# Patient Record
Sex: Male | Born: 1950 | Race: Black or African American | Hispanic: No | Marital: Married | Smoking: Never smoker
Health system: Southern US, Community
[De-identification: ages and names within clinical notes are randomized; demographics above are authoritative.]

## PROBLEM LIST (undated history)

## (undated) DIAGNOSIS — E785 Hyperlipidemia, unspecified: Secondary | ICD-10-CM

## (undated) DIAGNOSIS — K635 Polyp of colon: Secondary | ICD-10-CM

## (undated) DIAGNOSIS — M1990 Unspecified osteoarthritis, unspecified site: Secondary | ICD-10-CM

## (undated) DIAGNOSIS — I10 Essential (primary) hypertension: Secondary | ICD-10-CM

## (undated) DIAGNOSIS — Z87442 Personal history of urinary calculi: Secondary | ICD-10-CM

## (undated) HISTORY — PX: COLONOSCOPY: SHX174

## (undated) HISTORY — PX: CATARACT EXTRACTION: SUR2

## (undated) HISTORY — PX: TOTAL HIP ARTHROPLASTY: SHX124

## (undated) HISTORY — DX: Hyperlipidemia, unspecified: E78.5

## (undated) HISTORY — DX: Polyp of colon: K63.5

---

## 2017-05-29 ENCOUNTER — Encounter: Payer: Self-pay | Admitting: Sports Medicine

## 2017-05-29 ENCOUNTER — Ambulatory Visit (INDEPENDENT_AMBULATORY_CARE_PROVIDER_SITE_OTHER): Payer: Medicare Other | Admitting: Sports Medicine

## 2017-05-29 VITALS — BP 140/70 | HR 61 | Ht 72.0 in | Wt 255.8 lb

## 2017-05-29 DIAGNOSIS — M1611 Unilateral primary osteoarthritis, right hip: Secondary | ICD-10-CM | POA: Diagnosis not present

## 2017-05-29 MED ORDER — CELECOXIB 200 MG PO CAPS: 200.0000 mg | ORAL_CAPSULE | Freq: Every day | ORAL | 1 refills | Status: DC

## 2017-05-29 NOTE — Progress Notes (Signed)
Veverly FellsMichael D. Delorise [name], DO  [place] [facility] [phone]  Gerald AlyJohn Espana - 67 y.o. male MRN [id number]  Date of birth: [birth year]  Visit Date: 05/29/2017  PCP: [name], [name], NP   Referred by: No ref. provider found   Scribe for today's visit: Christoper FabianMolly Weber, LAT, ATC     SUBJECTIVE:  Gerald Dorsey is here for New Patient (Initial Visit) (R hip and thigh pain) .   His R quad symptoms INITIALLY: Began approximately 9 months with no MOI Described as Aching 5-6/10  Pain at it's worst but has no pain at rest.  Will occasionally be stabbing. Nonradiating Worsened with increased activity, golfing (rotational movements are worse than linear).  Increased pain w/ ascending stairs compared to descending. Improved with IBU and Famoditine. Additional associated symptoms include: no radiating pain and no N/T    At this time symptoms are worsening compared to onset increased pain w/ physical activity. He has been taking IBU and Famoditine.  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 [name] at [facility].  Had a R hip xray about a year ago.   ROS Denies night time disturbances. Denies fevers, chills, or night sweats. Denies unexplained weight loss. Denies personal history of cancer. Denies changes in bowel or bladder habits. Denies recent unreported falls. Denies new or worsening dyspnea or wheezing. Denies headaches or dizziness.  Denies numbness, tingling or weakness  In the extremities.  Denies dizziness or presyncopal episodes Denies lower extremity edema     HISTORY & PERTINENT PRIOR DATA:  Prior History reviewed and updated per electronic medical record.  Significant history, findings, studies and interim changes include:  reports that  has never smoked. he has never used smokeless tobacco. No results for input(s): HGBA1C, LABURIC, CREATINE in the last 8760 hours. No specialty comments available. Problem    Primary Osteoarthritis of Right Hip    OBJECTIVE:  VS:  HT:6' (182.9 cm)   WT:255 lb 12.8 oz (116 kg)  BMI:34.69    BP:140/70  HR:61bpm  TEMP: ( )  RESP:93 %   PHYSICAL EXAM: Constitutional: WDWN, Non-toxic appearing. Psychiatric: Alert & appropriately interactive. Not depressed or anxious appearing. Respiratory: No increased work of breathing. Trachea Midline Eyes: Pupils are equal. EOM intact without nystagmus. No scleral icterus Cardiovascular:  Peripheral Pulses: peripheral pulses symmetrical No clubbing or cyanosis appreciated Capillary Refill is normal, less than 2 seconds No signficant generalized edema/anasarca Sensory Exam: intact to light touch  Right hip: Pain with FADIR and Faber testing.  Worse with Stinchfield testing.  Hip abduction strength is intact.  Logroll is positive.  Negative straight leg raise.   Findings:  [facility] that are not able to be obtained but physical exam is consistent with osteoarthritis.  Has been told that he needs a hip replacement    ASSESSMENT & PLAN:   1. Primary osteoarthritis of right hip    PLAN: Symptoms are consistent with osteoarthritis of the hip to options including injection versus changing NSAID therapy.  We will try Celebrex and if any lack of improvement we will consider intra-articular injection in 2 weeks prior to his upcoming trip to [place].  >50% of this 30 minute visit spent in direct patient counseling and/or coordination of care.  Discussion was focused on education regarding the in discussing the pathoetiology and anticipated clinical course of the above condition.  ++++++++++++++++++++++++++++++++++++++++++++ Orders & Meds: No orders of the  defined types were placed in this encounter.   Meds ordered this encounter  Medications  . celecoxib (CELEBREX) 200 MG capsule    Sig: Take 1 capsule (200 mg total) by mouth daily.    Dispense:  90 capsule    Refill:  1     ++++++++++++++++++++++++++++++++++++++++++++ Follow-up: Return in about 2 weeks (around 06/12/2017) for For consideration of injection.   Pertinent documentation may be included in additional procedure notes, imaging studies, problem based documentation and patient instructions. Please see these sections of the encounter for additional information regarding this visit. CMA/ATC served as Neurosurgeon during this visit. History, Physical, and Plan performed by medical provider. Documentation and orders reviewed and attested to.      [name], DO    [place] [facility]

## 2017-06-06 ENCOUNTER — Ambulatory Visit: Payer: Self-pay

## 2017-06-06 NOTE — Telephone Encounter (Signed)
Patient called in to verify how long to take the Celebrex, informed patient the doctor did not indicate in his note how many days, advise to continue to take until his next OV on 06/19/17 and it would be discussed with the doctor at that time, he verbalized understanding and says the medicine is working.

## 2017-06-08 DIAGNOSIS — M1611 Unilateral primary osteoarthritis, right hip: Secondary | ICD-10-CM | POA: Insufficient documentation

## 2017-06-19 ENCOUNTER — Ambulatory Visit (INDEPENDENT_AMBULATORY_CARE_PROVIDER_SITE_OTHER): Payer: Medicare Other | Admitting: Sports Medicine

## 2017-06-19 ENCOUNTER — Encounter: Payer: Self-pay | Admitting: Sports Medicine

## 2017-06-19 VITALS — BP 138/82 | HR 60 | Ht 72.0 in | Wt 255.0 lb

## 2017-06-19 DIAGNOSIS — M1611 Unilateral primary osteoarthritis, right hip: Secondary | ICD-10-CM

## 2017-06-19 NOTE — Progress Notes (Signed)
Gerald FellsMichael D. Dorsey [name], DO  [place] [facility] [place] [phone]  Gerald AlyJohn Dorsey - 67 y.o. male MRN [id number]  Date of birth: [birth year]  Visit Date: 06/19/2017  PCP: Gerald JunglingEdwards, [name], NP   Referred by: Gerald JunglingEdwards, [name], NP   Scribe for today's visit: Gerald FabianMolly Dorsey, LAT, ATC     SUBJECTIVE:  Gerald Dorsey is here for Follow-up (R hip pain) .   Notes from initial visit on 05/29/17: His R quad symptoms INITIALLY: Began approximately 9 months with no MOI Described as Aching 5-6/10  Pain at it's worst but has no pain at rest.  Will occasionally be stabbing. Nonradiating Worsened with increased activity, golfing (rotational movements are worse than linear).  Increased pain w/ ascending stairs compared to descending. Improved with IBU and Famoditine. Additional associated symptoms include: no radiating pain and no N/T    At this time symptoms are worsening compared to onset increased pain w/ physical activity. He has been taking IBU and Famoditine.  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 [name] at [facility].  Had a R hip xray about a year ago.    Compared to the last office visit on 05/29/17, his previously described R hip pain symptoms are improving w/ less pain w/ walking for exercise.  States that he doesn't get the giving-way sensation anymore. Current symptoms are mild-moderate & are nonradiating He has been taking Celebrex.    ROS Denies night time disturbances. Denies fevers, chills, or night sweats. Denies unexplained weight loss. Denies personal history of cancer. Denies changes in bowel or bladder habits. Denies recent unreported falls. Denies new or worsening dyspnea or wheezing. Reports headaches or dizziness. Mild HA since beginning the Celebrex. Denies numbness, tingling or weakness  In the extremities.  Denies dizziness or presyncopal episodes Denies lower extremity edema     HISTORY &  PERTINENT PRIOR DATA:  Prior History reviewed and updated per electronic medical record.  Significant history, findings, studies and interim changes include:  reports that  has never smoked. he has never used smokeless tobacco. No results for input(s): HGBA1C, LABURIC, CREATINE in the last 8760 hours. No specialty comments available. No problems updated.  OBJECTIVE:  VS:  HT:6' (182.9 cm)   WT:255 lb (115.7 kg)  BMI:34.58    BP:138/82  HR:60bpm  TEMP: ( )  RESP:96 %   PHYSICAL EXAM: Constitutional: WDWN, Non-toxic appearing. Psychiatric: Alert & appropriately interactive.  Not depressed or anxious appearing. Respiratory: No increased work of breathing.  Trachea Midline Eyes: Pupils are equal.  EOM intact without nystagmus.  No scleral icterus  NEUROVASCULAR exam: No clubbing or cyanosis appreciated No significant venous stasis changes Capillary Refill: normal, less than 2 seconds   LOWER Extremities  Pre-tibial edema: No significant pretibial edema Pedal Pulses: Normal & symmetrically palpable Dermatomes intact to light touch Normal strength in all myotomes Reflexes: Normal & symmetric DTRs   Mildly limited internal rotation of the right hip compared to the left.  No pain with this.  No pain with axial load and circumduction of the hip.  Small amount of pain with Stinchfield. No additional findings.   ASSESSMENT & PLAN:   1. Primary osteoarthritis of right hip    PLAN: Overall he is done quite well.  Celebrex has been beneficial and would like for him to continue this for an additional 4 weeks of 6 weeks total and then can discontinue only due to the minimal pain that he  is having.  If he is having any type of worsening over the next several weeks he will call for an injection. We will plan for intermittent dosing after 6-weeks. No problem-specific Assessment & Plan notes found for this encounter.   ++++++++++++++++++++++++++++++++++++++++++++ Orders & Meds: No orders  of the defined types were placed in this encounter.   No orders of the defined types were placed in this encounter.   ++++++++++++++++++++++++++++++++++++++++++++ Follow-up: Return in about 4 weeks (around 07/17/2017).   Pertinent documentation may be included in additional procedure notes, imaging studies, problem based documentation and patient instructions. Please see these sections of the encounter for additional information regarding this visit. CMA/ATC served as Neurosurgeon during this visit. History, Physical, and Plan performed by medical provider. Documentation and orders reviewed and attested to.      Gerald Mews, DO    [place] [facility] Physician

## 2017-06-23 ENCOUNTER — Ambulatory Visit (INDEPENDENT_AMBULATORY_CARE_PROVIDER_SITE_OTHER): Payer: Medicare Other | Admitting: Sports Medicine

## 2017-06-23 ENCOUNTER — Ambulatory Visit: Payer: Self-pay

## 2017-06-23 ENCOUNTER — Encounter: Payer: Self-pay | Admitting: Sports Medicine

## 2017-06-23 VITALS — BP 142/84 | HR 54 | Ht 72.0 in | Wt 256.0 lb

## 2017-06-23 DIAGNOSIS — M1611 Unilateral primary osteoarthritis, right hip: Secondary | ICD-10-CM

## 2017-06-23 DIAGNOSIS — M25551 Pain in right hip: Secondary | ICD-10-CM | POA: Diagnosis not present

## 2017-06-23 NOTE — Procedures (Signed)
PROCEDURE NOTE:  Ultrasound Guided: Injection: Right hip, intra-articular Images were obtained and interpreted by myself, Gaspar BiddingMichael Jordy Hewins, DO  Images have been saved and stored to PACS system. Images obtained on: GE S7 Ultrasound machine  ULTRASOUND FINDINGS:  Osteophytic spurring of the anterior labrum  DESCRIPTION OF PROCEDURE:  The patient's clinical condition is marked by substantial pain and/or significant functional disability. Other conservative therapy has not provided relief, is contraindicated, or not appropriate. There is a reasonable likelihood that injection will significantly improve the patient's pain and/or functional impairment.  After discussing the risks, benefits and expected outcomes of the injection and all questions were reviewed and answered, the patient wished to undergo the above named procedure. Verbal consent was obtained.  The ultrasound was used to identify the target structure and adjacent neurovascular structures. The skin was then prepped in sterile fashion and the target structure was injected under direct visualization using sterile technique as below:  PREP: Alcohol, Ethel Chloride,  APPROACH: Anterior, direct, stopcock technique, 22g 3.5in. INJECTATE: 5 cc 1% lidocaine, 1cc 0.5% marcaine, 1cc 80mg /mL DepoMedrol   ASPIRATE: None   DRESSING: Band-Aid  Post procedural instructions including recommending icing and warning signs for infection were reviewed.   This procedure was well tolerated and there were no complications.    IMPRESSION: Succesful Ultrasound Guided: Injection

## 2017-06-23 NOTE — Progress Notes (Addendum)
  Veverly FellsMichael D. Delorise [name], DO  [place] [facility] [place] [phone]  Gerald AlyJohn Burrough - 67 y.o. male MRN [id number]  Date of birth: 18-Jun-1950  Visit Date: 06/23/2017  PCP: [name], [name], NP   Referred by: [name], [name], NP   Scribe for today's visit: Christoper FabianMolly Weber, LAT, ATC     SUBJECTIVE:  Gerald Dorsey is here for Follow-up (R hip pain) .   Compared to the last office visit on 06/19/17, his previously described R hip and thigh pain symptoms show no change but says he pushed himself in terms of exercise this week and started to experience some giving-way in his R leg/hip. Current symptoms are moderate & are radiating to the R thigh. He has been taking Celebrex and IBU and famotidine.   ROS Denies night time disturbances. Denies fevers, chills, or night sweats. Denies unexplained weight loss. Denies personal history of cancer. Denies changes in bowel or bladder habits. Denies recent unreported falls. Denies new or worsening dyspnea or wheezing. Denies headaches or dizziness.  Denies numbness, tingling or weakness  In the extremities.  Denies dizziness or presyncopal episodes Denies lower extremity edema     HISTORY & PERTINENT PRIOR DATA:  Prior History reviewed and updated per electronic medical record.  Significant history, findings, studies and interim changes include:  reports that  has never smoked. he has never used smokeless tobacco. No results for input(s): HGBA1C, LABURIC, CREATINE in the last 8760 hours. No specialty comments available. Problem  Primary Osteoarthritis of Right Hip   Injected today to 05/2017     OBJECTIVE:  VS:  HT:6' (182.9 cm)   WT:256 lb (116.1 kg)  BMI:34.71    BP:(!) 142/84  HR:(!) 54bpm  TEMP: ( )  RESP:96 %   PHYSICAL EXAM: Constitutional: WDWN, Non-toxic appearing. Psychiatric: Alert & appropriately interactive.  Not depressed or anxious appearing. Respiratory: No increased work of breathing.  Trachea  Midline Eyes: Pupils are equal.  EOM intact without nystagmus.  No scleral icterus  NEUROVASCULAR exam: No clubbing or cyanosis appreciated No significant venous stasis changes Capillary Refill: normal, less than 2 seconds    Right hip has somewhat limited range of motion.  Pain radiates to the thigh.  He does walk with a slightly coxalgia gait but this is minimal. No additional findings.   ASSESSMENT & PLAN:   1. Right hip pain   2. Primary osteoarthritis of right hip    PLAN:    Primary osteoarthritis of right hip Follow-up in 6 weeks   ++++++++++++++++++++++++++++++++++++++++++++ Orders & Meds: Orders Placed This Encounter  Procedures  . US MSK POCT ULTRASOUND    No orders of the defined types were placed in this encounter.   ++++++++++++++++++++++++++++++++++++++++++++ Follow-up: Return in about 6 weeks (around 08/04/2017).   Pertinent documentation may be included in additional procedure notes, imaging studies, problem based documentation and patient instructions. Please see these sections of the encounter for additional information regarding this visit. CMA/ATC served as Neurosurgeonscribe during this visit. History, Physical, and Plan performed by medical provider. Documentation and orders reviewed and attested to.      Andrena MewsMichael D Janeil Schexnayder, DO    [facility] Physician

## 2017-06-23 NOTE — Patient Instructions (Signed)

## 2017-06-23 NOTE — Assessment & Plan Note (Signed)
Follow-up in 6 weeks

## 2017-07-10 ENCOUNTER — Ambulatory Visit: Payer: Medicare Other | Admitting: Sports Medicine

## 2017-07-11 ENCOUNTER — Encounter: Payer: Self-pay | Admitting: Sports Medicine

## 2017-07-11 ENCOUNTER — Ambulatory Visit (INDEPENDENT_AMBULATORY_CARE_PROVIDER_SITE_OTHER): Payer: Medicare Other

## 2017-07-11 ENCOUNTER — Ambulatory Visit (INDEPENDENT_AMBULATORY_CARE_PROVIDER_SITE_OTHER): Payer: Medicare Other | Admitting: Sports Medicine

## 2017-07-11 VITALS — BP 140/82 | HR 71 | Ht 72.0 in | Wt 253.6 lb

## 2017-07-11 DIAGNOSIS — M25551 Pain in right hip: Secondary | ICD-10-CM | POA: Diagnosis not present

## 2017-07-11 DIAGNOSIS — M1611 Unilateral primary osteoarthritis, right hip: Secondary | ICD-10-CM

## 2017-07-11 DIAGNOSIS — M79604 Pain in right leg: Secondary | ICD-10-CM

## 2017-07-11 MED ORDER — GABAPENTIN 300 MG PO CAPS: ORAL_CAPSULE | ORAL | 1 refills | Status: DC

## 2017-07-11 NOTE — Patient Instructions (Signed)
Also check out [employer]"Foundation Training" which is a program developed by Dr. Myles LippsEric Goodman.   There are links to a couple of his YouTube Videos below and I would like to see performing one of his videos 5-6 days per week.    A good intro video is: "Independence from Pain 7-minute Video" - [email or web address]   His more advanced video is: "Powerful Posture and Pain Relief: 12 minutes of Foundation Training" - https://youtu.be/[email or web address]  Do not try to attempt this entire video when first beginning.    Try breaking of each exercise that he goes into shorter segments.  Otherwise if they perform an exercise for 45 seconds, start with 15 seconds and rest and then resume when they begin the new activity.    If you work your way up to doing this 12 minute video, I expect you will see significant improvements in your pain.  If you enjoy his videos and would like to find out more you can look on his website: [email or web address].  He has a workout streaming option as well as a DVD set available for purchase.  Amazon has the best price for his DVDs.     Please perform the exercise program that we have prepared for you and gone over in detail on a daily basis.  In addition to the handout you were provided you can access your program through: [email or web address]   Your unique program code is: [email or web address]

## 2017-07-11 NOTE — Progress Notes (Signed)
Veverly FellsMichael D. Delorise [name], DO  [facility] [facility] [place] [phone]  Gerald AlyJohn Dorsey - 67 y.o. male MRN [id number]  Date of birth: 1950/09/17  Visit Date: 07/11/2017  PCP: [name], [name], NP   Referred by: [name], [name], NP   Scribe for today's visit: Stevenson ClinchBrandy Coleman, CMA     SUBJECTIVE:  Gerald AlyJohn Dorsey is here for Follow-up (R thigh pain)  06/23/17 Compared to the last office visit on 06/19/17, his previously described R hip and thigh pain symptoms show no change but says he pushed himself in terms of exercise this week and started to experience some giving-way in his R leg/hip. Current symptoms are moderate & are radiating to the R thigh. He has been taking Celebrex and IBU and famotidine.   07/11/17 Compared to the last office visit, his previously described symptoms are improving, injection helped with hip/groin pain but he continues to have pain in the R thigh. He has noticed continued soreness in the thigh when going for walks. When he is sedentary he doesn't have pain. He has noticed increased stiffness after sitting for prolonged periods of time. He has also noticed the hip catching.  Current symptoms are moderate & are nonradiating He has been taking Celebrex only prn with some relief. He has d/c IBU and famotidine while on Celebrex.    ROS Denies night time disturbances. Denies fevers, chills, or night sweats. Denies unexplained weight loss. Denies personal history of cancer. Denies changes in bowel or bladder habits. Denies recent unreported falls. Denies new or worsening dyspnea or wheezing. Denies headaches or dizziness.  Denies numbness, tingling or weakness  In the extremities.  Denies dizziness or presyncopal episodes Denies lower extremity edema     HISTORY & PERTINENT PRIOR DATA:  Prior History reviewed and updated per electronic medical record.  Significant history, findings, studies and interim changes include:  reports that  has  never smoked. he has never used smokeless tobacco. No results for input(s): HGBA1C, LABURIC, CREATINE in the last 8760 hours. No specialty comments available. No problems updated.  OBJECTIVE:  VS:  HT:6' (182.9 cm)   WT:253 lb 9.6 oz (115 kg)  BMI:34.39    BP:140/82  HR:71bpm  TEMP: ( )  RESP:95 %   PHYSICAL EXAM: Constitutional: WDWN, Non-toxic appearing. Psychiatric: Alert & appropriately interactive.  Not depressed or anxious appearing. Respiratory: No increased work of breathing.  Trachea Midline Eyes: Pupils are equal.  EOM intact without nystagmus.  No scleral icterus  NEUROVASCULAR exam: No clubbing or cyanosis appreciated No significant venous stasis changes Capillary Refill: normal, less than 2 seconds   Leg and hip: Overall good range of motion of his hips with internal and external rotation without significant pain.  Pain is present with terminal with a tear.  He has minimal pain with femoral stretch test does have a slightly tight hip flexor on the right compared to left.  Negative straight leg raise.  Small amount of pain over the anterior thigh but this is minimal.  He does have a slight weakness with hip flexion on the right compared to the left.  He has no significant palpable defect within the mid thigh which is where he does localize majority of his pain.   ASSESSMENT & PLAN:   1. Right leg pain    ++++++++++++++++++++++++++++++++++++++++++++ Orders & Meds:  Orders Placed This Encounter  Procedures  . DG Lumbar Spine Complete    Meds ordered this encounter  Medications  . gabapentin (NEURONTIN) 300  MG capsule    Sig: Start with 1 tab po qhs X 1 week, then increase to 1 tab po bid X 1 week then 1 tab po tid prn    Dispense:  90 capsule    Refill:  1    ++++++++++++++++++++++++++++++++++++++++++++ PLAN:  Findings:  This is a complicated issue that I believe may be more multifactorial than initially realized.  He does have a degree of osteoarthritis of  the right hip that is mild in nature.  Given the small amount of weakness that he has in the right hip concern for potential neurogenic cause was investigated with plain film x-rays today that did show some mild degenerative changes but this was once again really quite mild.  Given the pain that he is having I would like to see how he responds to gabapentin for both diagnostic and therapeutic purposes.  If he does have good improvement but persistent weakness or persistent/incomplete relief of his pain further diagnostic evaluation with EMGs/nerve conduction studies could be considered but would recommend x-rays of the hip thigh and possibly knee initially.  Also could consider MRI of the lumbar spine versus MRI of the hip.   No problem-specific Assessment & Plan notes found for this encounter.  >50% of this 25 minute visit spent in direct patient counseling and/or coordination of care.  Discussion was focused on education regarding the in discussing the pathoetiology and anticipated clinical course of the above condition.  Follow-up: Return for after you return from your trip.   Pertinent documentation may be included in additional procedure notes, imaging studies, problem based documentation and patient instructions. Please see these sections of the encounter for additional information regarding this visit. CMA/ATC served as Neurosurgeon during this visit. History, Physical, and Plan performed by medical provider. Documentation and orders reviewed and attested to.      [name], DO    [facility] Physician

## 2017-07-16 ENCOUNTER — Encounter: Payer: Self-pay | Admitting: Sports Medicine

## 2017-07-16 NOTE — Procedures (Signed)
PROCEDURE NOTE: THERAPEUTIC EXERCISES (97110) 15 minutes spent for Therapeutic exercises as below and as referenced in the AVS. This included exercises focusing on stretching, strengthening, with significant focus on eccentric aspects.  Proper technique shown and discussed handout in great detail with ATC. All questions were discussed and answered.   Long term goals include an improvement in range of motion, strength, endurance as well as avoiding reinjury. Frequency of visits is one time as determined during today's  office visit. Frequency of exercises to be performed is as per handout.  EXERCISES REVIEWED:  Hip abduction strengthening  Hip stretching and strengthening  Core back conditioning  [employer] program

## 2017-07-26 ENCOUNTER — Encounter: Payer: Self-pay | Admitting: Sports Medicine

## 2017-08-01 ENCOUNTER — Ambulatory Visit (INDEPENDENT_AMBULATORY_CARE_PROVIDER_SITE_OTHER): Payer: Medicare Other

## 2017-08-01 ENCOUNTER — Encounter: Payer: Self-pay | Admitting: Sports Medicine

## 2017-08-01 ENCOUNTER — Ambulatory Visit: Payer: Medicare Other | Admitting: Sports Medicine

## 2017-08-01 ENCOUNTER — Other Ambulatory Visit: Payer: Self-pay | Admitting: Sports Medicine

## 2017-08-01 ENCOUNTER — Ambulatory Visit (INDEPENDENT_AMBULATORY_CARE_PROVIDER_SITE_OTHER): Payer: Medicare Other | Admitting: Sports Medicine

## 2017-08-01 VITALS — BP 130/80 | HR 58 | Ht 72.0 in | Wt 254.6 lb

## 2017-08-01 DIAGNOSIS — M170 Bilateral primary osteoarthritis of knee: Secondary | ICD-10-CM

## 2017-08-01 DIAGNOSIS — M25561 Pain in right knee: Secondary | ICD-10-CM

## 2017-08-01 DIAGNOSIS — M25562 Pain in left knee: Secondary | ICD-10-CM

## 2017-08-01 DIAGNOSIS — M1611 Unilateral primary osteoarthritis, right hip: Secondary | ICD-10-CM

## 2017-08-01 DIAGNOSIS — M79604 Pain in right leg: Secondary | ICD-10-CM

## 2017-08-01 MED ORDER — FAMOTIDINE 20 MG PO TABS
20.0000 mg | ORAL_TABLET | Freq: Two times a day (BID) | ORAL | 1 refills | Status: DC
Start: 2017-08-01 — End: 2018-04-24

## 2017-08-01 MED ORDER — IBUPROFEN 200 MG PO TABS: 200.0000 mg | ORAL_TABLET | Freq: Four times a day (QID) | ORAL | 1 refills | Status: DC | PRN

## 2017-08-01 NOTE — Progress Notes (Signed)
  OBJECTIVE:  VS:  HT:6' (182.9 cm)   WT:254 lb 9.6 oz (115.5 kg)  BMI:34.52    BP:130/80  HR: (!) 58 bpm  TEMP: ( )  RESP:94 %   PHYSICAL EXAM: Constitutional: WDWN, Non-toxic appearing. Psychiatric: Alert & appropriately interactive.  Not depressed or anxious appearing. Respiratory: No increased work of breathing.  Trachea Midline Eyes: Pupils are equal.  EOM intact without nystagmus.  No scleral icterus  NEUROVASCULAR exam: No clubbing or cyanosis appreciated No significant venous stasis changes Capillary Refill: normal, less than 2 seconds   Bilateral legs overall well aligned without significant deformity.  He has limited internal rotation of the right hip compared to the left.  He has full flexion extension bilateral knees but does have crepitation with patellar grind on the left greater than right.  Ligamentously his knees are stable. Pain with Stinchfield testing and slightly painful with Pearlean BrownieFaber testing and although this does seem to localize to his distal thigh where he is most painful.  Extensor mechanism strength is intact.  Hip abduction strength is intact and quite good.  ASSESSMENT & PLAN:   1. Right leg pain   2. Arthralgia of both knees   3. Primary osteoarthritis of right hip   4. Primary osteoarthritis of both knees    Orders & Meds:  Orders Placed This Encounter  Procedures  . XR KNEE 3 VIEW LEFT  . XR Knee 1-2 Views Right  . XR FEMUR, MIN 2 VIEWS RIGHT    Meds ordered this encounter  Medications  . ibuprofen (ADVIL,MOTRIN) 200 MG tablet    Sig: Take 1 tablet (200 mg total) by mouth every 6 (six) hours as needed.    Dispense:  30 tablet    Refill:  1  . famotidine (PEPCID) 20 MG tablet    Sig: Take 1 tablet (20 mg total) by mouth 2 (two) times daily. Take with NSAID    Dispense:  60 tablet    Refill:  1     PLAN:    Primary osteoarthritis of right hip Unfortunately he had only minimal improvement in his symptoms following intra-articular  injection.  He does report that sharp shooting pain localizing to his groin is significantly improved however the dull thigh pain that is constantly present and worsens with activity has not changed in character or nature at all.  He is interested in possibly pursuing repeat injection in 2 weeks to see if he can have any additional benefit from Kenalog as opposed to Depo-Medrol and I do think this is worthwhile.  In the interim we will have him restart generic Duexis and avoid exacerbating activities.   Primary osteoarthritis of both knees He does have mild changes of the right knee and moderate on the left.  We discussed using compression with Body Helix Compression Sleeve and continuing with avoidance activities.  Can consider injections in the future if he is interested.   Follow-up: Return in 2 weeks (on 08/15/2017).

## 2017-08-01 NOTE — Assessment & Plan Note (Signed)
Unfortunately he had only minimal improvement in his symptoms following intra-articular injection.  He does report that sharp shooting pain localizing to his groin is significantly improved however the dull thigh pain that is constantly present and worsens with activity has not changed in character or nature at all.  He is interested in possibly pursuing repeat injection in 2 weeks to see if he can have any additional benefit from Kenalog as opposed to Depo-Medrol and I do think this is worthwhile.  In the interim we will have him restart generic Duexis and avoid exacerbating activities.

## 2017-08-01 NOTE — Patient Instructions (Signed)
I recommend you obtained a compression sleeve to help with your joint problems. There are many options on the market however I recommend obtaining a knee Body Helix compression sleeve.  You can find information (including how to appropriate measure yourself for sizing) can be found at www.Body Helix.com.  Many of these products are health savings account (HSA) eligible.   You can use the compression sleeve at any time throughout the day but is most important to use while being active as well as for 2 hours post-activity.   It is appropriate to ice following activity with the compression sleeve in place.   

## 2017-08-01 NOTE — Assessment & Plan Note (Signed)
He does have mild changes of the right knee and moderate on the left.  We discussed using compression with Body Helix Compression Sleeve and continuing with avoidance activities.  Can consider injections in the future if he is interested.

## 2017-08-01 NOTE — Procedures (Signed)
X-Rays obtained at [facility] Interpreted by myself [name](Michael D Rigby, DO) during office visit.  Results were reviewed with the patient at the time of the visit.   3 VIEW X-RAY of: Bilateral knees & 2 views right femur FINDINGS:   Bilateral knees do show mild degenerative changes and bilateral medial and patellofemoral compartments most notably on the left medial compartment.  There is osteophytic spurring in slight cortical irregularity.  No acute fracture or dislocation appreciated  Normal contours of the femur  IMPRESSION:  1. Left knee moderate osteoarthritis most notably in the medial compartment and patellofemoral compartment 2. Right knee mild arthritis of the patellofemoral compartment 3. No specific findings of the femur to explain his mid thigh pain, likely related to his underlying osteoarthritis of his hip.

## 2017-08-01 NOTE — Progress Notes (Signed)
  Veverly FellsMichael D. Delorise [name], DO  [facility] [phone]  Gerald AlyJohn Dorsey - 67 y.o. male MRN [id number]  Date of birth: 10/28/1950  Visit Date: 08/01/2017  PCP: [name], [name], NP   Referred by: [name], [name], NP   Scribe for today's visit: Stevenson ClinchBrandy Coleman, CMA     SUBJECTIVE:  Gerald AlyJohn Dorsey is here for Follow-up (R hip and leg pain)  07/11/17 Compared to the last office visit, his previously described symptoms are improving, injection helped with hip/groin pain but he continues to have pain in the R thigh. He has noticed continued soreness in the thigh when going for walks. When he is sedentary he doesn't have pain. He has noticed increased stiffness after sitting for prolonged periods of time. He has also noticed the hip catching.  Current symptoms are moderate & are nonradiating He has been taking Celebrex only prn with some relief. He has d/c IBU and famotidine while on Celebrex.   08/01/17: Compared to the last office visit, his previously described symptoms show no change. He has noticed occasional sharp pain in the groin. The majority of the pain is in the lower thigh. Recently he has noticed pain in the L groin. He has also started to notice increased pain in the L knee. He hasn't noticed any swelling around the knee but has noticed increased tightness and he has difficulty completely straightening the leg.  Current symptoms are moderate & are nonradiating He has been taking Gabapentin with minimal relief.  He has been taking IBU prn for pain. He has been active recently, playing golf. He has been noticing a catching pain when walking from the golf cart. He has not discomfort when sitting or lying down.  He has been doing home exercises on days when he is less active. He noticed increased soreness in the thigh the following day.    ROS Denies night time disturbances. Denies fevers, chills, or night sweats. Denies unexplained weight loss. Denies  personal history of cancer. Denies changes in bowel or bladder habits. Denies recent unreported falls. Denies new or worsening dyspnea or wheezing. Denies headaches or dizziness.  Denies numbness, tingling or weakness  In the extremities.  Denies dizziness or presyncopal episodes Denies lower extremity edema    HISTORY & PERTINENT PRIOR DATA:  Prior History reviewed and updated per electronic medical record.  Significant history, findings, studies and interim changes include:  reports that  has never smoked. he has never used smokeless tobacco. No results for input(s): HGBA1C, LABURIC, CREATINE in the last 8760 hours. No specialty comments available. Problem  Primary Osteoarthritis of Both Knees  Primary Osteoarthritis of Right Hip   Injected 06/23/2017 with DEPO-Medrol 80mg  X-ray at [facility]) - Mild to moderate per report       Please see additional documentation for Objective, Assessment and Plan sections. Pertinent additional documentation may be included in corresponding procedure notes, imaging studies, problem based documentation and patient instructions. Please see these sections of the encounter for additional information regarding this visit.  CMA/ATC served as Neurosurgeonscribe during this visit. History, Physical, and Plan performed by medical provider. Documentation and orders reviewed and attested to.      Andrena MewsMichael D Aleyah Balik, DO    [facility] Physician

## 2017-08-16 ENCOUNTER — Ambulatory Visit (INDEPENDENT_AMBULATORY_CARE_PROVIDER_SITE_OTHER): Payer: Medicare Other | Admitting: Sports Medicine

## 2017-08-16 ENCOUNTER — Ambulatory Visit: Payer: Self-pay

## 2017-08-16 ENCOUNTER — Encounter: Payer: Self-pay | Admitting: Sports Medicine

## 2017-08-16 VITALS — BP 130/80 | HR 59 | Ht 72.0 in | Wt 249.8 lb

## 2017-08-16 DIAGNOSIS — M79604 Pain in right leg: Secondary | ICD-10-CM

## 2017-08-16 DIAGNOSIS — M1611 Unilateral primary osteoarthritis, right hip: Secondary | ICD-10-CM

## 2017-08-16 DIAGNOSIS — M25561 Pain in right knee: Secondary | ICD-10-CM | POA: Diagnosis not present

## 2017-08-16 DIAGNOSIS — M25562 Pain in left knee: Secondary | ICD-10-CM

## 2017-08-16 DIAGNOSIS — M170 Bilateral primary osteoarthritis of knee: Secondary | ICD-10-CM

## 2017-08-16 NOTE — Patient Instructions (Signed)

## 2017-08-16 NOTE — Procedures (Signed)
PROCEDURE NOTE:  Ultrasound Guided: Injection: Left knee Images were obtained and interpreted by myself, Gaspar BiddingMichael Rigby, DO  Images have been saved and stored to PACS system. Images obtained on: GE S7 Ultrasound machine    ULTRASOUND FINDINGS:  Small joint effusion  DESCRIPTION OF PROCEDURE:  The patient's clinical condition is marked by substantial pain and/or significant functional disability. Other conservative therapy has not provided relief, is contraindicated, or not appropriate. There is a reasonable likelihood that injection will significantly improve the patient's pain and/or functional impairment.   After discussing the risks, benefits and expected outcomes of the injection and all questions were reviewed and answered, the patient wished to undergo the above named procedure.  Verbal consent was obtained.  The ultrasound was used to identify the target structure and adjacent neurovascular structures. The skin was then prepped in sterile fashion and the target structure was injected under direct visualization using sterile technique as below:  PREP: Alcohol, Ethel Chloride  APPROACH: superiolateral, single injection, 25g 1.5in.  INJECTATE:2cc: 0.5% marcaine, 1cc: 40mg /mL DepoMedrol  ASPIRATE: None   DRESSING: Band-Aid    Post procedural instructions including recommending icing and warning signs for infection were reviewed.    This procedure was well tolerated and there were no complications.   IMPRESSION: Succesful Ultrasound Guided: Injection

## 2017-08-16 NOTE — Procedures (Signed)
PROCEDURE NOTE:  Ultrasound Guided: Injection: Right hip Images were obtained and interpreted by myself, Gaspar BiddingMichael Evelean Bigler, DO  Images have been saved and stored to PACS system. Images obtained on: GE S7 Ultrasound machine    ULTRASOUND FINDINGS:  Small effusion, osteophytic spurs appreciated  DESCRIPTION OF PROCEDURE:  The patient's clinical condition is marked by substantial pain and/or significant functional disability. Other conservative therapy has not provided relief, is contraindicated, or not appropriate. There is a reasonable likelihood that injection will significantly improve the patient's pain and/or functional impairment.   After discussing the risks, benefits and expected outcomes of the injection and all questions were reviewed and answered, the patient wished to undergo the above named procedure.  Verbal consent was obtained.  The ultrasound was used to identify the target structure and adjacent neurovascular structures. The skin was then prepped in sterile fashion and the target structure was injected under direct visualization using sterile technique as below:  PREP: Alcohol, Ethel Chloride APPROACH: direct, stopcock technique, 22g 3.5in.  INJECTATE: 5cc: 1% lidocaine, 2cc: 0.5% marcaine, 1cc 40mg /mL Depo-Medrol + 1cc 40mg /mL Kenalog   ASPIRATE: None   DRESSING: Band-Aid  Post procedural instructions including recommending icing and warning signs for infection were reviewed.    This procedure was well tolerated and there were no complications.   IMPRESSION: Succesful Ultrasound Guided: Injection

## 2017-08-16 NOTE — Progress Notes (Signed)
Veverly FellsMichael D. Delorise [name], DO  [facility] [phone]  Gerald AlyJohn Justo - 67 y.o. male MRN [id number]  Date of birth: 1950/10/26  Visit Date: 08/16/2017  PCP: [name], [name], NP   Referred by: [name], [name], NP  Scribe for today's visit: Christoper FabianMolly Weber, LAT, ATC     SUBJECTIVE:  Gerald Dorsey is here for Follow-up (R hip pain and L knee pain) .   07/11/17 Compared to the last office visit, his previously described symptoms are improving, injection helped with hip/groin pain but he continues to have pain in the R thigh. He has noticed continued soreness in the thigh when going for walks. When he is sedentary he doesn't have pain. He has noticed increased stiffness after sitting for prolonged periods of time. He has also noticed the hip catching.  Current symptoms are moderate & are nonradiating He has been taking Celebrex only prn with some relief. He has d/c IBU and famotidine while on Celebrex.   08/01/17: Compared to the last office visit, his previously described symptoms show no change. He has noticed occasional sharp pain in the groin. The majority of the pain is in the lower thigh. Recently he has noticed pain in the L groin. He has also started to notice increased pain in the L knee. He hasn't noticed any swelling around the knee but has noticed increased tightness and he has difficulty completely straightening the leg.  Current symptoms are moderate & are nonradiating He has been taking Gabapentin with minimal relief.  He has been taking IBU prn for pain. He has been active recently, playing golf. He has been noticing a catching pain when walking from the golf cart. He has not discomfort when sitting or lying down.  He has been doing home exercises on days when he is less active. He noticed increased soreness in the thigh the following day.   08/16/17: Compared to the last office visit on 08/16/17, his previously described R hip and L knee pain  symptoms show no change.  L knee was bothering him more. He has stopped doing his long walks and hasn't been doing any rotational activities.  He hasn't golfed in 2 weeks. Current symptoms are mild-mod in R hip/thigh and mod in his L knee & hip pain radiates to R thigh. He has been taking and famotidine and IBU.  Had XRs of his R knee and femur at his last visit.  He would like to get an injection in his R hip and hopefully his L knee too.  ROS Denies night time disturbances. Denies fevers, chills, or night sweats. Denies unexplained weight loss. Denies personal history of cancer. Denies changes in bowel or bladder habits. Denies recent unreported falls. Denies new or worsening dyspnea or wheezing. Denies headaches or dizziness.  Denies numbness, tingling or weakness  In the extremities.  Denies dizziness or presyncopal episodes Denies lower extremity edema    HISTORY & PERTINENT PRIOR DATA:  Prior History reviewed and updated per electronic medical record.  Significant/pertinent history, findings, studies include:  reports that he has never smoked. He has never used smokeless tobacco. No results for input(s): HGBA1C, LABURIC, CREATINE in the last 8760 hours. No specialty comments available. No problems updated.  OBJECTIVE:  VS:  HT:6' (182.9 cm)   WT:249 lb 12.8 oz (113.3 kg)  BMI:33.87    BP:130/80  HR:(Abnormal) 59bpm  TEMP: ( )  RESP:93 %   PHYSICAL EXAM: Constitutional: WDWN, Non-toxic appearing. Psychiatric: Alert &  appropriately interactive.  Not depressed or anxious appearing. Respiratory: No increased work of breathing.  Trachea Midline Eyes: Pupils are equal.  EOM intact without nystagmus.  No scleral icterus  VASCULAR: warm to touch calf supple with no pain with squeeze lower extremity neuro exam: unremarkable  Left knee overall well aligned with generalized swelling but no significant effusion.  He has a small amount of pain with medial and lateral joint line  palpation but is ligamentously stable and has a small click with McMurray's. Right hip continues to have pain with Stinchfield testing and logroll.  ASSESSMENT & PLAN:   1. Primary osteoarthritis of right hip   2. Right leg pain   3. Arthralgia of both knees   4. Primary osteoarthritis of both knees     PLAN: Repeat right hip injection today.  If any lack of improvement with this further diagnostic evaluation can be pursued with MRI of the hip but unfortunately it appears that he likely has more advanced arthritis in his hip and x-rays were previously suggested.  Total hip arthroplasty may be the only option that we have going forward he is aware of this.  We will plan to follow-up with him in 6 weeks and ensure both the issues of were addressed today have improved.    Follow-up: Return in about 6 weeks (around 09/27/2017).      Please see additional documentation for Objective, Assessment and Plan sections. Pertinent additional documentation may be included in corresponding procedure notes, imaging studies, problem based documentation and patient instructions. Please see these sections of the encounter for additional information regarding this visit.  CMA/ATC served as Neurosurgeon during this visit. History, Physical, and Plan performed by medical provider. Documentation and orders reviewed and attested to.      [name], DO    [facility] Physician

## 2017-08-17 ENCOUNTER — Encounter: Payer: Self-pay | Admitting: Sports Medicine

## 2017-09-27 ENCOUNTER — Ambulatory Visit: Payer: Medicare Other | Admitting: Sports Medicine

## 2017-10-03 ENCOUNTER — Ambulatory Visit (INDEPENDENT_AMBULATORY_CARE_PROVIDER_SITE_OTHER): Payer: Medicare Other | Admitting: Sports Medicine

## 2017-10-03 ENCOUNTER — Encounter: Payer: Self-pay | Admitting: Sports Medicine

## 2017-10-03 VITALS — BP 120/78 | HR 61 | Ht 72.0 in | Wt 237.6 lb

## 2017-10-03 DIAGNOSIS — M25551 Pain in right hip: Secondary | ICD-10-CM | POA: Diagnosis not present

## 2017-10-03 NOTE — Patient Instructions (Signed)

## 2017-10-03 NOTE — Progress Notes (Signed)
Gerald Dorsey. [facility] at [facility] [phone]  Gerald Dorsey - 67 y.o. male MRN [id number]  Date of birth: 04-Feb-1951  Visit Date: 10/03/2017  PCP: Filomena Jungling, NP   Referred by: Filomena Jungling, NP  Scribe for today's visit: [name], LAT, ATC     SUBJECTIVE:  Gerald Dorsey is here for Follow-up (R hip pain and L knee pain) .   07/11/17 Compared to the last office visit, his previously described symptoms are improving, injection helped with hip/groin pain but he continues to have pain in the R thigh. He has noticed continued soreness in the thigh when going for walks. When he is sedentary he doesn't have pain. He has noticed increased stiffness after sitting for prolonged periods of time. He has also noticed the hip catching.  Current symptoms are moderate & are nonradiating He has been taking Celebrex only prn with some relief. He has d/c IBU and famotidine while on Celebrex.   08/01/17: Compared to the last office visit, his previously described symptoms show no change. He has noticed occasional sharp pain in the groin. The majority of the pain is in the lower thigh. Recently he has noticed pain in the L groin. He has also started to notice increased pain in the L knee. He hasn't noticed any swelling around the knee but has noticed increased tightness and he has difficulty completely straightening the leg.  Current symptoms are moderate & are nonradiating He has been taking Gabapentin with minimal relief.  He has been taking IBU prn for pain. He has been active recently, playing golf. He has been noticing a catching pain when walking from the golf cart. He has not discomfort when sitting or lying down.  He has been doing home exercises on days when he is less active. He noticed increased soreness in the thigh the following day.   08/16/17: Compared to the last office visit on 08/01/17, his previously described R hip and L knee pain  symptoms show no change.  L knee was bothering him more. He has stopped doing his long walks and hasn't been doing any rotational activities.  He hasn't golfed in 2 weeks. Current symptoms are mild-mod in R hip/thigh and mod in his L knee & hip pain radiates to R thigh. He has been taking and famotidine and IBU.  Had XRs of his R knee and femur at his last visit.  He would like to get an injection in his R hip and hopefully his L knee too.  10/03/2017: Compared to the last office visit on 08/16/17 , his previously described R hip symptoms show no change.  L knee is doing much better.  States that his R mid thigh pain continues and feels like there's a knot in his R thigh and he notes this pain when he IR/ER his R hip.  Has 2 golf outings coming up this week. Current symptoms are mild-mod sharp and aching  & are nonradiating He has been taking famotidine and IBU.  Had knee XR on 08/01/17 and L-spine XR on 07/11/17.  Had injections into both R hip and L knee at last visit.  He feels like the L knee injection helped more than the R hip.   R hip injection helped w/ the groin pain but didn't touch his ant thigh pain.  ROS Denies night time disturbances. Denies fevers, chills, or night sweats. Denies unexplained weight loss. Denies personal history of cancer. Denies changes  in bowel or bladder habits. Denies recent unreported falls. Denies new or worsening dyspnea or wheezing. Denies headaches or dizziness.  Denies numbness, tingling or weakness  In the extremities.  Denies dizziness or presyncopal episodes Denies lower extremity edema    HISTORY & PERTINENT PRIOR DATA:  Prior History reviewed and updated per electronic medical record.  Significant/pertinent history, findings, studies include:  reports that he has never smoked. He has never used smokeless tobacco. No results for input(s): HGBA1C, LABURIC, CREATINE in the last 8760 hours. No specialty comments available. No problems  updated.  OBJECTIVE:  VS:  HT:6' (182.9 cm)   WT:237 lb 9.6 oz (107.8 kg)  BMI:32.22    BP:120/78  HR:61bpm  TEMP: ( )  RESP:95 %   PHYSICAL EXAM: Constitutional: WDWN, Non-toxic appearing. Psychiatric: Alert & appropriately interactive.  Not depressed or anxious appearing. Respiratory: No increased work of breathing.  Trachea Midline Eyes: Pupils are equal.  EOM intact without nystagmus.  No scleral icterus  Vascular Exam: warm to touch no edema  lower extremity neuro exam: unremarkable normal strength normal sensation normal reflexes  MSK Exam: Mild pain with logroll and figure testing however is most painful with Stinchfield testing.  He has good range of motion of the hip with pain localizing to the thigh with [facility] testing.  Otherwise lower extremity strength is intact.  Hip abduction strength is improved.  No significant atrophy.   ASSESSMENT & PLAN:   1. Right hip pain     PLAN: Given the ongoing issues and a good resolution of his groin pain with radicular injection with only mild improvement I pain further diagnostic evaluation with right hip MR arthrogram indicated at this time.  This is been going on for quite some time his x-rays previously reviewed only mild degenerative changes however suspect this may be more of a radio occult degenerative change versus focal labral tearing.  Follow-up: Return for mri review in person.      Please see additional documentation for Objective, Assessment and Plan sections. Pertinent additional documentation may be included in corresponding procedure notes, imaging studies, problem based documentation and patient instructions. Please see these sections of the encounter for additional information regarding this visit.  CMA/ATC served as Neurosurgeon during this visit. History, Physical, and Plan performed by medical provider. Documentation and orders reviewed and attested to.      [name], DO    [facility]  Physician

## 2017-11-03 ENCOUNTER — Ambulatory Visit
Admission: RE | Admit: 2017-11-03 | Discharge: 2017-11-03 | Disposition: A | Discharge status: Home / Self Care | Payer: Medicare Other | Source: Ambulatory Visit | Attending: Sports Medicine | Admitting: Sports Medicine

## 2017-11-03 DIAGNOSIS — M25551 Pain in right hip: Secondary | ICD-10-CM

## 2017-11-03 MED ORDER — IOPAMIDOL (ISOVUE-M 200) INJECTION 41%
12.0000 mL | Freq: Once | INTRAMUSCULAR | Status: AC
  Administered 2017-11-03: 12 mL via INTRA_ARTICULAR

## 2017-11-07 ENCOUNTER — Ambulatory Visit (INDEPENDENT_AMBULATORY_CARE_PROVIDER_SITE_OTHER): Payer: Medicare Other | Admitting: Sports Medicine

## 2017-11-07 ENCOUNTER — Encounter: Payer: Self-pay | Admitting: Sports Medicine

## 2017-11-07 VITALS — BP 142/76 | HR 64 | Ht 72.0 in | Wt 237.6 lb

## 2017-11-07 DIAGNOSIS — M1611 Unilateral primary osteoarthritis, right hip: Secondary | ICD-10-CM | POA: Diagnosis not present

## 2017-11-07 DIAGNOSIS — M25551 Pain in right hip: Secondary | ICD-10-CM

## 2017-11-07 NOTE — Patient Instructions (Signed)
Dr. Doneen Poissonhristopher Blackman at [facility]: [phone]

## 2017-11-07 NOTE — Progress Notes (Signed)
Gerald Dorsey. Gerald Dorsey [facility] [phone]  Gerald Dorsey - 67 y.o. male MRN [id number]  Date of birth: 02-15-1951  Visit Date: 11/07/2017  PCP: Filomena Jungling, NP   Referred by: Filomena Jungling, NP  Scribe(s) for today's visit: [name], LAT, ATC  SUBJECTIVE:  Gerald Dorsey is here for Follow-up (R hip and thigh) .    07/11/17 Compared to the last office visit, his previously described symptoms are improving, injection helped with hip/groin pain but he continues to have pain in the R thigh. He has noticed continued soreness in the thigh when going for walks. When he is sedentary he doesn't have pain. He has noticed increased stiffness after sitting for prolonged periods of time. He has also noticed the hip catching.  Current symptoms are moderate & are nonradiating He has been taking Celebrex only prn with some relief. He has d/c IBU and famotidine while on Celebrex.   08/01/17: Compared to the last office visit, his previously described symptoms show no change. He has noticed occasional sharp pain in the groin. The majority of the pain is in the lower thigh. Recently he has noticed pain in the L groin. He has also started to notice increased pain in the L knee. He hasn't noticed any swelling around the knee but has noticed increased tightness and he has difficulty completely straightening the leg.  Current symptoms are moderate & are nonradiating He has been taking Gabapentin with minimal relief.  He has been taking IBU prn for pain. He has been active recently, playing golf. He has been noticing a catching pain when walking from the golf cart. He has not discomfort when sitting or lying down.  He has been doing home exercises on days when he is less active. He noticed increased soreness in the thigh the following day.   08/16/17: Compared to the last office visit on 08/01/17, his previously described R hip and L knee pain symptoms  show no change.  L knee was bothering him more. He has stopped doing his long walks and hasn't been doing any rotational activities.  He hasn't golfed in 2 weeks. Current symptoms are mild-mod in R hip/thigh and mod in his L knee & hip pain radiates to R thigh. He has been taking and famotidine and IBU.  Had XRs of his R knee and femur at his last visit.  He would like to get an injection in his R hip and hopefully his L knee too.  10/03/2017: Compared to the last office visit on 08/16/17 , his previously described R hip symptoms show no change.  L knee is doing much better.  States that his R mid thigh pain continues and feels like there's a knot in his R thigh and he notes this pain when he IR/ER his R hip.  Has 2 golf outings coming up this week. Current symptoms are mild-mod sharp and aching  & are nonradiating He has been taking famotidine and IBU.  Had knee XR on 08/01/17 and L-spine XR on 07/11/17.  Had injections into both R hip and L knee at last visit.  He feels like the L knee injection helped more than the R hip.   R hip injection helped w/ the groin pain but didn't touch his ant thigh pain.  11/07/17: Compared to the last office visit on 10/03/17, his previously described R hip and thigh symptoms are improved from his very first visit.  Went for  a 1.5 mile walk yesterday and felt pretty good other than being sore and stiff. Current symptoms are mild & are radiating to R thigh. He has been taking IBU and famotidine prn.  L-spine XR on 07/11/17 and knee XR on 08/01/17.  R hip MRI arthrogram on 11/03/17. L knee and R hip injections on 08/16/17   REVIEW OF SYSTEMS: Denies night time disturbances. Denies fevers, chills, or night sweats. Denies unexplained weight loss. Denies personal history of cancer. Denies changes in bowel or bladder habits. Denies recent unreported falls. Denies new or worsening dyspnea or wheezing. Denies headaches or dizziness.  Denies numbness, tingling or  weakness  In the extremities.  Denies dizziness or presyncopal episodes Denies lower extremity edema    HISTORY & PERTINENT PRIOR DATA:  Prior History reviewed and updated per electronic medical record.  Significant/pertinent history, findings, studies include:  reports that he has never smoked. He has never used smokeless tobacco. No results for input(s): HGBA1C, LABURIC, CREATINE in the last 8760 hours. No specialty comments available. No problems updated.  OBJECTIVE:  VS:  HT:6' (182.9 cm)   WT:237 lb 9.6 oz (107.8 kg)  BMI:32.22    BP:(Abnormal) 142/76  HR:64bpm  TEMP: ( )  RESP:95 %   PHYSICAL EXAM: CONSTITUTIONAL: Well-developed, Well-nourished and In no acute distress Alert & appropriately interactive. and Not depressed or anxious appearing. Respiratory: No increased work of breathing.  Trachea Midline EYES: Pupils are equal., EOM intact without nystagmus. and No scleral icterus.  Lower extremities: Warm and well perfused NEURO: unremarkable  MSK Exam: Right hip and knee: Marland Kitchen. Well aligned, no significant deformity. . No overlying skin changes. . No focal bony tenderness . Marked pain with terminal external and internal rotation of the hip.  Positive for a year and Pearlean BrownieFaber test but mild.  Negative fulcrum test.  . Hip abduction strength is 5 out of 5 with a well-balanced but still TFL predominant recruitment pattern.   PROCEDURES & DATA REVIEWED:  MRI of the hip reviewed that does show severe right hip pain that explains the majority of his symptoms.  ASSESSMENT   1. Right hip pain   2. Primary osteoarthritis of right hip     PLAN:  >50% of this 25 minute visit spent in direct patient counseling and/or coordination of care.  Discussion was focused on education regarding the in discussing the pathoetiology and anticipated clinical course of the above condition.  Ultimately long discussion was had regarding operative versus nonoperative options.  Continued  conservative management is appropriate.  Is aware that ultimately he will likely require total hip arthroplasty to have relief of his symptoms.  He will call for an appointment and information for Dr. Magnus IvanBlackman was provided.  He is likely should have this done later in the year.   Follow-up: Return if symptoms worsen or fail to improve.      Please see additional documentation for Objective, Assessment and Plan sections. Pertinent additional documentation may be included in corresponding procedure notes, imaging studies, problem based documentation and patient instructions. Please see these sections of the encounter for additional information regarding this visit.  CMA/ATC served as Neurosurgeonscribe during this visit. History, Physical, and Plan performed by medical provider. Documentation and orders reviewed and attested to.      Andrena MewsMichael D Timtohy Broski, DO    [facility]

## 2017-11-08 ENCOUNTER — Ambulatory Visit: Payer: Medicare Other | Admitting: Sports Medicine

## 2018-01-07 ENCOUNTER — Encounter: Payer: Self-pay | Admitting: Sports Medicine

## 2018-01-25 ENCOUNTER — Telehealth: Payer: Self-pay | Admitting: Sports Medicine

## 2018-01-25 ENCOUNTER — Other Ambulatory Visit: Payer: Self-pay | Admitting: Physical Therapy

## 2018-01-25 DIAGNOSIS — M1611 Unilateral primary osteoarthritis, right hip: Secondary | ICD-10-CM

## 2018-01-25 NOTE — Telephone Encounter (Signed)
See note.   Copied from CRM [id number]. Topic: General - Other >> Jan 25, 2018  9:15 AM Trula Slade wrote: Reason for CRM:   Patient would like a return call from [facility] about a referral.  Please call him at [phone]

## 2018-01-25 NOTE — Telephone Encounter (Signed)
Returned pt's call and placed a referral for him to see Dr. Magnus Ivan at [facility] regarding his R hip and groin.

## 2018-02-07 ENCOUNTER — Ambulatory Visit (INDEPENDENT_AMBULATORY_CARE_PROVIDER_SITE_OTHER): Payer: Medicare Other | Admitting: Orthopaedic Surgery

## 2018-02-07 ENCOUNTER — Encounter (INDEPENDENT_AMBULATORY_CARE_PROVIDER_SITE_OTHER): Payer: Self-pay | Admitting: Orthopaedic Surgery

## 2018-02-07 ENCOUNTER — Ambulatory Visit (INDEPENDENT_AMBULATORY_CARE_PROVIDER_SITE_OTHER): Payer: Medicare Other

## 2018-02-07 DIAGNOSIS — M1611 Unilateral primary osteoarthritis, right hip: Secondary | ICD-10-CM | POA: Diagnosis not present

## 2018-02-07 NOTE — Progress Notes (Signed)
Office Visit Note   Patient: Gerald Dorsey           Date of Birth: 1951-05-07           MRN: [id number] Visit Date: 02/07/2018              Requested by: [name], [name], NP [address] [place], [place] [address] PCP: [name], [name], NP   Assessment & Plan: Visit Diagnoses:  1. Primary osteoarthritis of right hip     Plan: Based on his plain film findings combined with his MRI findings as well as his physical exam and the failure of conservative treatment measures of all modalities, I do feel that a total hip replacement is warranted at this point as to see.  We a long thorough discussion about what the surgery involves.  We talked about the risk and benefits of surgery.  We discussed his intraoperative and postoperative course as well.  He would like to have this done in December of this year.  I do feel this is reasonable.  He is otherwise very healthy individual with no significant active medical problems at all.  All question concerns were answered and addressed.  We will work on scheduling the surgery.  Follow-Up Instructions: Return for 2 weeks post-op.   Orders:  Orders Placed This Encounter  Procedures  . XR HIP UNILAT W OR W/O PELVIS 1V RIGHT   No orders of the defined types were placed in this encounter.     Procedures: No procedures performed   Clinical Data: No additional findings.   Subjective: Chief Complaint  Patient presents with  . Right Hip - Pain  Patient is a very pleasant 67 year old gentleman who is referred from Dr. Gaspar BiddingMichael Rigby to evaluate worsening hip pain is been going on for over a year now.  Plain films and an MRI confirmed end-stage arthritis of the right hip.  He does have groin pain and some thigh pain.  He is very active individual but is having a slow down due to the detrimental effect of his right hip pain is having on his active daily living, his mobility and his quality of life.  He has had an intra-articular injection.  He is  worked on activity modification.  He takes anti-inflammatories.  He is work on hip and quad strengthening exercises as well.  None of this is helped.  At this point he would like to consider total hip replacement surgery.  HPI  Review of Systems He currently denies any headache, chest pain, shortness of breath, fever, chills, nausea, vomiting.  Objective: Vital Signs: There were no vitals taken for this visit.  Physical Exam He is alert and oriented x3 and in no acute distress Ortho Exam Examination of his left hip is normal examination of his right hip shows stiffness with internal and external rotation and significant pain in the groin with internal and external rotation.  His leg lengths are equal. Specialty Comments:  No specialty comments available.  Imaging: Xr Hip Unilat W Or W/o Pelvis 1v Right  Result Date: 02/07/2018 An AP pelvis standing shows severe end-stage arthritis of the right hip.  There is complete loss of superior lateral joint space.  There is sclerotic changes in the femoral head and acetabulum.  There are para-articular osteophytes.  There is cystic changes in the acetabulum femoral head as well.    PMFS History: Patient Active Problem List   Diagnosis Date Noted  . Primary osteoarthritis of both knees 08/01/2017  .  Primary osteoarthritis of right hip 06/08/2017   Past Medical History:  Diagnosis Date  . Colon polyps    Hx of colon polyps per pt report - no date  . Hyperlipidemia     Family History  Problem Relation Age of Onset  . Hyperlipidemia Mother   . Hypertension Mother     History reviewed. No pertinent surgical history. Social History   Occupational History  . Not on file  Tobacco Use  . Smoking status: Never Smoker  . Smokeless tobacco: Never Used  Substance and Sexual Activity  . Alcohol use: Not on file  . Drug use: Not on file  . Sexual activity: Not on file

## 2018-04-13 ENCOUNTER — Other Ambulatory Visit (INDEPENDENT_AMBULATORY_CARE_PROVIDER_SITE_OTHER): Payer: Self-pay | Admitting: Physician Assistant

## 2018-04-16 NOTE — Pre-Procedure Instructions (Signed)
Gerald Dorsey  04/16/2018      [facility] - [place], [place] [place] [address] Phone: [phone] Fax: [phone]    Your procedure is scheduled on Tuesday November 3rd.  [facility] Admitting at 1000 A.M.  Call this number if you have problems the morning of surgery:  [phone]   Remember:  Do not eat or drink after midnight.    Take these medicines the morning of surgery with A SIP OF WATER   famotidine (PEPCID)   7 days prior to surgery STOP taking any Aspirin(unless otherwise instructed by your surgeon), Aleve, Naproxen, Ibuprofen, Motrin, Advil, Goody's, BC's, all herbal medications, fish oil, and all vitamins     Do not wear jewelry.  Do not wear lotions, powders, or colognes, or deodorant.  Men may shave face and neck.  Do not bring valuables to the hospital.  [facility] is not responsible for any belongings or valuables.  Contacts, dentures or bridgework may not be worn into surgery.  Leave your suitcase in the car.  After surgery it may be brought to your room.  For patients admitted to the hospital, discharge time will be determined by your treatment team.  Patients discharged the day of surgery will not be allowed to drive home.    [place]- Preparing For Surgery  Before surgery, you can play an important role. Because skin is not sterile, your skin needs to be as free of germs as possible. You can reduce the number of germs on your skin by washing with CHG (chlorahexidine gluconate) Soap before surgery.  CHG is an antiseptic cleaner which kills germs and bonds with the skin to continue killing germs even after washing.    Oral Hygiene is also important to reduce your risk of infection.  Remember - BRUSH YOUR TEETH THE MORNING OF SURGERY WITH YOUR REGULAR TOOTHPASTE  Please do not use if you have an allergy to CHG or antibacterial soaps. If your skin becomes  reddened/irritated stop using the CHG.  Do not shave (including legs and underarms) for at least 48 hours prior to first CHG shower. It is OK to shave your face.  Please follow these instructions carefully.   1. Shower the NIGHT BEFORE SURGERY and the MORNING OF SURGERY with CHG.   2. If you chose to wash your hair, wash your hair first as usual with your normal shampoo.  3. After you shampoo, rinse your hair and body thoroughly to remove the shampoo.  4. Use CHG as you would any other liquid soap. You can apply CHG directly to the skin and wash gently with a scrungie or a clean washcloth.   5. Apply the CHG Soap to your body ONLY FROM THE NECK DOWN.  Do not use on open wounds or open sores. Avoid contact with your eyes, ears, mouth and genitals (private parts). Wash Face and genitals (private parts)  with your normal soap.  6. Wash thoroughly, paying special attention to the area where your surgery will be performed.  7. Thoroughly rinse your body with warm water from the neck down.  8. DO NOT shower/wash with your normal soap after using and rinsing off the CHG Soap.  9. Pat yourself dry with a CLEAN TOWEL.  10. Wear CLEAN PAJAMAS to bed the night before surgery, wear comfortable clothes the morning of surgery  11. Place CLEAN SHEETS on your bed the night of  your first shower and DO NOT SLEEP WITH PETS.    Day of Surgery:  Do not apply any deodorants/lotions.  Please wear clean clothes to the hospital/surgery center.   Remember to brush your teeth WITH YOUR REGULAR TOOTHPASTE.    Please read over the following fact sheets that you were given.

## 2018-04-17 ENCOUNTER — Encounter (HOSPITAL_COMMUNITY): Payer: Self-pay

## 2018-04-17 ENCOUNTER — Other Ambulatory Visit: Payer: Self-pay

## 2018-04-17 ENCOUNTER — Encounter (HOSPITAL_COMMUNITY)
Admission: RE | Admit: 2018-04-17 | Discharge: 2018-04-17 | Disposition: A | Discharge status: Home / Self Care | Payer: Medicare Other | Source: Ambulatory Visit | Attending: Orthopaedic Surgery | Admitting: Orthopaedic Surgery

## 2018-04-17 DIAGNOSIS — Z01812 Encounter for preprocedural laboratory examination: Secondary | ICD-10-CM | POA: Diagnosis present

## 2018-04-17 HISTORY — DX: Unspecified osteoarthritis, unspecified site: M19.90

## 2018-04-17 HISTORY — DX: Personal history of urinary calculi: Z87.442

## 2018-04-17 LAB — SURGICAL PCR SCREEN
MRSA, PCR: NEGATIVE
STAPHYLOCOCCUS AUREUS: NEGATIVE

## 2018-04-17 LAB — BASIC METABOLIC PANEL
ANION GAP: 4 — AB (ref 5–15)
BUN: 13 mg/dL (ref 8–23)
CHLORIDE: 108 mmol/L (ref 98–111)
CO2: 28 mmol/L (ref 22–32)
CREATININE: 1.4 mg/dL — AB (ref 0.61–1.24)
Calcium: 9.2 mg/dL (ref 8.9–10.3)
GFR calc non Af Amer: 51 mL/min — ABNORMAL LOW (ref >60)
GFR, EST AFRICAN AMERICAN: 59 mL/min — AB (ref >60)
Glucose, Bld: 98 mg/dL (ref 70–99)
Potassium: 4.5 mmol/L (ref 3.5–5.1)
SODIUM: 140 mmol/L (ref 135–145)

## 2018-04-17 LAB — CBC
HEMATOCRIT: 43 % (ref 39.0–52.0)
Hemoglobin: 13.5 g/dL (ref 13.0–17.0)
MCH: 28.7 pg (ref 26.0–34.0)
MCHC: 31.4 g/dL (ref 30.0–36.0)
MCV: 91.3 fL (ref 80.0–100.0)
NRBC: 0 % (ref 0.0–0.2)
PLATELETS: 213 10*3/uL (ref 150–400)
RBC: 4.71 MIL/uL (ref 4.22–5.81)
RDW: 13.3 % (ref 11.5–15.5)
WBC: 5.8 10*3/uL (ref 4.0–10.5)

## 2018-04-17 NOTE — Pre-Procedure Instructions (Signed)
Gerald AlyJohn Dorsey  04/17/2018      [facility] - [place], [place] [place] [address] Phone: [phone] Fax: [phone]    Your procedure is scheduled on Tuesday December 3rd.  [facility] Ad[facility] at 1000 A.M.  Call this number if you have problems the morning of surgery:  [phone]   Remember:  Do not eat or drink after midnight.    Take these medicines the morning of surgery with A SIP OF WATER   famotidine (PEPCID)   7 days prior to surgery STOP taking any Aspirin(unless otherwise instructed by your surgeon), Aleve, Naproxen, Ibuprofen, Motrin, Advil, Goody's, BC's, all herbal medications, fish oil, and all vitamins     Do not wear jewelry.  Do not wear lotions, powders, or colognes, or deodorant.  Men may shave face and neck.  Do not bring valuables to the hospital.  [facility] is not responsible for any belongings or valuables.  Contacts, dentures or bridgework may not be worn into surgery.  Leave your suitcase in the car.  After surgery it may be brought to your room.  For patients admitted to the hospital, discharge time will be determined by your treatment team.  Patients discharged the day of surgery will not be allowed to drive home.    [place]- Preparing For Surgery  Before surgery, you can play an important role. Because skin is not sterile, your skin needs to be as free of germs as possible. You can reduce the number of germs on your skin by washing with CHG (chlorahexidine gluconate) Soap before surgery.  CHG is an antiseptic cleaner which kills germs and bonds with the skin to continue killing germs even after washing.    Oral Hygiene is also important to reduce your risk of infection.  Remember - BRUSH YOUR TEETH THE MORNING OF SURGERY WITH YOUR REGULAR TOOTHPASTE  Please do not use if you have an allergy to CHG or antibacterial soaps. If your skin becomes  reddened/irritated stop using the CHG.  Do not shave (including legs and underarms) for at least 48 hours prior to first CHG shower. It is OK to shave your face.  Please follow these instructions carefully.   1. Shower the NIGHT BEFORE SURGERY and the MORNING OF SURGERY with CHG.   2. If you chose to wash your hair, wash your hair first as usual with your normal shampoo.  3. After you shampoo, rinse your hair and body thoroughly to remove the shampoo.  4. Use CHG as you would any other liquid soap. You can apply CHG directly to the skin and wash gently with a scrungie or a clean washcloth.   5. Apply the CHG Soap to your body ONLY FROM THE NECK DOWN.  Do not use on open wounds or open sores. Avoid contact with your eyes, ears, mouth and genitals (private parts). Wash Face and genitals (private parts)  with your normal soap.  6. Wash thoroughly, paying special attention to the area where your surgery will be performed.  7. Thoroughly rinse your body with warm water from the neck down.  8. DO NOT shower/wash with your normal soap after using and rinsing off the CHG Soap.  9. Pat yourself dry with a CLEAN TOWEL.  10. Wear CLEAN PAJAMAS to bed the night before surgery, wear comfortable clothes the morning of surgery  11. Place CLEAN SHEETS on your bed the night of  your first shower and DO NOT SLEEP WITH PETS.    Day of Surgery:  Do not apply any deodorants/lotions.  Please wear clean clothes to the hospital/surgery center.   Remember to brush your teeth WITH YOUR REGULAR TOOTHPASTE.    Please read over the following fact sheets that you were given.

## 2018-04-17 NOTE — Progress Notes (Signed)
PCP - Dr. Randa EvensEdwards Cardiologist - patient denies  Chest x-ray - n/a EKG - 09/13/2017; tracing requested Stress Test - patient denies ECHO - patient denies Cardiac Cath - patient denies  Sleep Study - patient denies  Blood Thinner Instructions: patient instructed to stop celebrex 7 days prior to surgery  Anesthesia review: yes, abnormal EKG (Care Everywhere); tracing requested from [facility]  Patient denies shortness of breath, fever, cough and chest pain at PAT appointment   Patient verbalized understanding of instructions that were given to them at the PAT appointment. Patient was also instructed that they will need to review over the PAT instructions again at home before surgery.

## 2018-04-18 NOTE — Anesthesia Preprocedure Evaluation (Addendum)
Anesthesia Evaluation  Patient identified by MRN, date of birth, ID band Patient awake    Reviewed: Allergy & Precautions, NPO status , Patient's Chart, lab work & pertinent test results  History of Anesthesia Complications Negative for: history of anesthetic complications  Airway Mallampati: II  TM Distance: >3 FB Neck ROM: Full    Dental no notable dental hx.    Pulmonary neg pulmonary ROS,    Pulmonary exam normal        Cardiovascular negative cardio ROS Normal cardiovascular exam     Neuro/Psych negative neurological ROS  negative psych ROS   GI/Hepatic negative GI ROS, Neg liver ROS,   Endo/Other  negative endocrine ROS  Renal/GU Renal InsufficiencyRenal disease (kidney stones; Cr 1.4)  negative genitourinary   Musculoskeletal  (+) Arthritis , Osteoarthritis,    Abdominal   Peds  Hematology negative hematology ROS (+)   Anesthesia Other Findings   Reproductive/Obstetrics                            Anesthesia Physical Anesthesia Plan  ASA: II  Anesthesia Plan: Spinal   Post-op Pain Management:    Induction:   PONV Risk Score and Plan: 1 and Propofol infusion, TIVA and Treatment may vary due to age or medical condition  Airway Management Planned: Nasal Cannula and Simple Face Mask  Additional Equipment: None  Intra-op Plan:   Post-operative Plan:   Informed Consent: I have reviewed the patients History and Physical, chart, labs and discussed the procedure including the risks, benefits and alternatives for the proposed anesthesia with the patient or authorized representative who has indicated his/her understanding and acceptance.     Plan Discussed with:   Anesthesia Plan Comments: (EKG from [facility] during ED admission for renal stone 09/13/2017 shows diffuse nonspecific ST elevations. Pt with no cardiac history. No CV symptoms. Discussed with Dr. Noreene LarssonJoslin, will order preop  EKG to be done DOS for baseline/comparison.  )      Anesthesia Quick Evaluation

## 2018-04-23 MED ORDER — CEFAZOLIN SODIUM-DEXTROSE 2-4 GM/100ML-% IV SOLN
2.0000 g | INTRAVENOUS | Status: AC
  Administered 2018-04-24: 2 g via INTRAVENOUS
  Filled 2018-04-23: qty 100

## 2018-04-23 MED ORDER — TRANEXAMIC ACID-NACL 1000-0.7 MG/100ML-% IV SOLN
1000.0000 mg | INTRAVENOUS | Status: AC
  Administered 2018-04-24: 1000 mg via INTRAVENOUS
  Filled 2018-04-23: qty 100

## 2018-04-24 ENCOUNTER — Encounter (HOSPITAL_COMMUNITY)
Admission: RE | Disposition: A | Discharge status: Home Health | Payer: Self-pay | Source: Home / Self Care | Attending: Orthopaedic Surgery

## 2018-04-24 ENCOUNTER — Inpatient Hospital Stay (HOSPITAL_COMMUNITY)
Admission: RE | Admit: 2018-04-24 | Discharge: 2018-04-26 | DRG: 470 | Disposition: A | Discharge status: Home Health | Payer: Medicare Other | Attending: Orthopaedic Surgery | Admitting: Orthopaedic Surgery

## 2018-04-24 ENCOUNTER — Inpatient Hospital Stay (HOSPITAL_COMMUNITY): Payer: Medicare Other

## 2018-04-24 ENCOUNTER — Other Ambulatory Visit: Payer: Self-pay

## 2018-04-24 ENCOUNTER — Inpatient Hospital Stay (HOSPITAL_COMMUNITY): Payer: Medicare Other | Admitting: Certified Registered Nurse Anesthetist

## 2018-04-24 ENCOUNTER — Inpatient Hospital Stay (HOSPITAL_COMMUNITY): Payer: Medicare Other | Admitting: Physician Assistant

## 2018-04-24 ENCOUNTER — Encounter (HOSPITAL_COMMUNITY): Payer: Self-pay | Admitting: *Deleted

## 2018-04-24 DIAGNOSIS — Z8349 Family history of other endocrine, nutritional and metabolic diseases: Secondary | ICD-10-CM

## 2018-04-24 DIAGNOSIS — Z8249 Family history of ischemic heart disease and other diseases of the circulatory system: Secondary | ICD-10-CM | POA: Diagnosis not present

## 2018-04-24 DIAGNOSIS — M2170 Unequal limb length (acquired), unspecified site: Secondary | ICD-10-CM | POA: Diagnosis present

## 2018-04-24 DIAGNOSIS — Z96641 Presence of right artificial hip joint: Secondary | ICD-10-CM

## 2018-04-24 DIAGNOSIS — Z87442 Personal history of urinary calculi: Secondary | ICD-10-CM

## 2018-04-24 DIAGNOSIS — Z419 Encounter for procedure for purposes other than remedying health state, unspecified: Secondary | ICD-10-CM

## 2018-04-24 DIAGNOSIS — M170 Bilateral primary osteoarthritis of knee: Secondary | ICD-10-CM | POA: Diagnosis present

## 2018-04-24 DIAGNOSIS — M1611 Unilateral primary osteoarthritis, right hip: Principal | ICD-10-CM | POA: Diagnosis present

## 2018-04-24 DIAGNOSIS — M25551 Pain in right hip: Secondary | ICD-10-CM | POA: Diagnosis present

## 2018-04-24 HISTORY — PX: TOTAL HIP ARTHROPLASTY: SHX124

## 2018-04-24 SURGERY — ARTHROPLASTY, HIP, TOTAL, ANTERIOR APPROACH
Anesthesia: Spinal | Site: Hip | Laterality: Right

## 2018-04-24 MED ORDER — OXYCODONE HCL 5 MG PO TABS: 5.0000 mg | ORAL_TABLET | Freq: Once | ORAL | Status: DC | PRN

## 2018-04-24 MED ORDER — OXYCODONE HCL 5 MG PO TABS
ORAL_TABLET | ORAL | Status: AC
  Filled 2018-04-24: qty 2

## 2018-04-24 MED ORDER — METHOCARBAMOL 500 MG PO TABS
ORAL_TABLET | ORAL | Status: AC
  Filled 2018-04-24: qty 1

## 2018-04-24 MED ORDER — DIPHENHYDRAMINE HCL 12.5 MG/5ML PO ELIX: 12.5000 mg | ORAL_SOLUTION | ORAL | Status: DC | PRN

## 2018-04-24 MED ORDER — CHLORHEXIDINE GLUCONATE 4 % EX LIQD
60.0000 mL | Freq: Once | CUTANEOUS | Status: DC
  Administered 2018-04-24: 4 via TOPICAL

## 2018-04-24 MED ORDER — PROPOFOL 500 MG/50ML IV EMUL
INTRAVENOUS | Status: DC | PRN
  Administered 2018-04-24: 75 ug/kg/min via INTRAVENOUS

## 2018-04-24 MED ORDER — ONDANSETRON HCL 4 MG PO TABS: 4.0000 mg | ORAL_TABLET | Freq: Four times a day (QID) | ORAL | Status: DC | PRN

## 2018-04-24 MED ORDER — OXYCODONE HCL 5 MG/5ML PO SOLN: 5.0000 mg | Freq: Once | ORAL | Status: DC | PRN

## 2018-04-24 MED ORDER — ALUM & MAG HYDROXIDE-SIMETH 200-200-20 MG/5ML PO SUSP: 30.0000 mL | ORAL | Status: DC | PRN

## 2018-04-24 MED ORDER — SODIUM CHLORIDE 0.9 % IR SOLN
Status: DC | PRN
  Administered 2018-04-24: 1

## 2018-04-24 MED ORDER — METHOCARBAMOL 1000 MG/10ML IJ SOLN
500.0000 mg | Freq: Four times a day (QID) | INTRAVENOUS | Status: DC | PRN
  Filled 2018-04-24: qty 5

## 2018-04-24 MED ORDER — BUPIVACAINE IN DEXTROSE 0.75-8.25 % IT SOLN
INTRATHECAL | Status: DC | PRN
  Administered 2018-04-24: 1.8 mL via INTRATHECAL

## 2018-04-24 MED ORDER — OXYCODONE HCL 5 MG PO TABS
10.0000 mg | ORAL_TABLET | ORAL | Status: DC | PRN
  Administered 2018-04-24: 15 mg via ORAL
  Administered 2018-04-24: 10 mg via ORAL
  Administered 2018-04-25 (×2): 15 mg via ORAL
  Administered 2018-04-25: 10 mg via ORAL
  Administered 2018-04-26: 15 mg via ORAL
  Filled 2018-04-24: qty 2
  Filled 2018-04-24 (×4): qty 3

## 2018-04-24 MED ORDER — LIDOCAINE HCL (CARDIAC) PF 100 MG/5ML IV SOSY
PREFILLED_SYRINGE | INTRAVENOUS | Status: DC | PRN
  Administered 2018-04-24: 100 mg via INTRAVENOUS

## 2018-04-24 MED ORDER — PROPOFOL 10 MG/ML IV BOLUS
INTRAVENOUS | Status: AC
  Filled 2018-04-24: qty 20

## 2018-04-24 MED ORDER — PHENYLEPHRINE HCL 10 MG/ML IJ SOLN
INTRAMUSCULAR | Status: DC | PRN
  Administered 2018-04-24 (×5): 80 ug via INTRAVENOUS
  Administered 2018-04-24: 40 ug via INTRAVENOUS

## 2018-04-24 MED ORDER — METOCLOPRAMIDE HCL 5 MG PO TABS: 5.0000 mg | ORAL_TABLET | Freq: Three times a day (TID) | ORAL | Status: DC | PRN

## 2018-04-24 MED ORDER — OXYCODONE HCL 5 MG PO TABS
5.0000 mg | ORAL_TABLET | ORAL | Status: DC | PRN
  Administered 2018-04-26: 5 mg via ORAL
  Filled 2018-04-24: qty 2

## 2018-04-24 MED ORDER — ASPIRIN 81 MG PO CHEW
81.0000 mg | CHEWABLE_TABLET | Freq: Two times a day (BID) | ORAL | Status: DC
  Administered 2018-04-24 – 2018-04-26 (×4): 81 mg via ORAL
  Filled 2018-04-24 (×4): qty 1

## 2018-04-24 MED ORDER — HYDROMORPHONE HCL 1 MG/ML IJ SOLN: 0.5000 mg | INTRAMUSCULAR | Status: DC | PRN

## 2018-04-24 MED ORDER — FENTANYL CITRATE (PF) 250 MCG/5ML IJ SOLN
INTRAMUSCULAR | Status: AC
  Filled 2018-04-24: qty 5

## 2018-04-24 MED ORDER — PHENYLEPHRINE 40 MCG/ML (10ML) SYRINGE FOR IV PUSH (FOR BLOOD PRESSURE SUPPORT)
PREFILLED_SYRINGE | INTRAVENOUS | Status: AC
  Filled 2018-04-24: qty 10

## 2018-04-24 MED ORDER — ONDANSETRON HCL 4 MG/2ML IJ SOLN: 4.0000 mg | Freq: Four times a day (QID) | INTRAMUSCULAR | Status: DC | PRN

## 2018-04-24 MED ORDER — FENTANYL CITRATE (PF) 100 MCG/2ML IJ SOLN
INTRAMUSCULAR | Status: AC
  Filled 2018-04-24: qty 2

## 2018-04-24 MED ORDER — POLYETHYLENE GLYCOL 3350 17 G PO PACK: 17.0000 g | PACK | Freq: Every day | ORAL | Status: DC | PRN

## 2018-04-24 MED ORDER — LIDOCAINE 2% (20 MG/ML) 5 ML SYRINGE
INTRAMUSCULAR | Status: AC
  Filled 2018-04-24: qty 5

## 2018-04-24 MED ORDER — SODIUM CHLORIDE 0.9 % IV SOLN
INTRAVENOUS | Status: DC
  Administered 2018-04-24: 18:00:00 via INTRAVENOUS

## 2018-04-24 MED ORDER — PROPOFOL 10 MG/ML IV BOLUS
INTRAVENOUS | Status: DC | PRN
  Administered 2018-04-24: 40 mg via INTRAVENOUS

## 2018-04-24 MED ORDER — FENTANYL CITRATE (PF) 100 MCG/2ML IJ SOLN
INTRAMUSCULAR | Status: DC | PRN
  Administered 2018-04-24: 50 ug via INTRAVENOUS

## 2018-04-24 MED ORDER — ONDANSETRON HCL 4 MG/2ML IJ SOLN
INTRAMUSCULAR | Status: DC | PRN
  Administered 2018-04-24: 4 mg via INTRAVENOUS

## 2018-04-24 MED ORDER — METOCLOPRAMIDE HCL 5 MG/ML IJ SOLN: 5.0000 mg | Freq: Three times a day (TID) | INTRAMUSCULAR | Status: DC | PRN

## 2018-04-24 MED ORDER — ONDANSETRON HCL 4 MG/2ML IJ SOLN: 4.0000 mg | Freq: Once | INTRAMUSCULAR | Status: DC | PRN

## 2018-04-24 MED ORDER — ZOLPIDEM TARTRATE 5 MG PO TABS: 5.0000 mg | ORAL_TABLET | Freq: Every evening | ORAL | Status: DC | PRN

## 2018-04-24 MED ORDER — MENTHOL 3 MG MT LOZG: 1.0000 | LOZENGE | OROMUCOSAL | Status: DC | PRN

## 2018-04-24 MED ORDER — ACETAMINOPHEN 325 MG PO TABS
325.0000 mg | ORAL_TABLET | Freq: Four times a day (QID) | ORAL | Status: DC | PRN
  Administered 2018-04-25: 650 mg via ORAL
  Filled 2018-04-24: qty 2

## 2018-04-24 MED ORDER — ONDANSETRON HCL 4 MG/2ML IJ SOLN
INTRAMUSCULAR | Status: AC
  Filled 2018-04-24: qty 2

## 2018-04-24 MED ORDER — DOCUSATE SODIUM 100 MG PO CAPS
100.0000 mg | ORAL_CAPSULE | Freq: Two times a day (BID) | ORAL | Status: DC
  Administered 2018-04-24 – 2018-04-26 (×4): 100 mg via ORAL
  Filled 2018-04-24 (×4): qty 1

## 2018-04-24 MED ORDER — PHENOL 1.4 % MT LIQD: 1.0000 | OROMUCOSAL | Status: DC | PRN

## 2018-04-24 MED ORDER — FENTANYL CITRATE (PF) 100 MCG/2ML IJ SOLN
25.0000 ug | INTRAMUSCULAR | Status: DC | PRN
  Administered 2018-04-24: 50 ug via INTRAVENOUS

## 2018-04-24 MED ORDER — 0.9 % SODIUM CHLORIDE (POUR BTL) OPTIME
TOPICAL | Status: DC | PRN
  Administered 2018-04-24: 1000 mL

## 2018-04-24 MED ORDER — MIDAZOLAM HCL 2 MG/2ML IJ SOLN
INTRAMUSCULAR | Status: DC | PRN
  Administered 2018-04-24: 2 mg via INTRAVENOUS

## 2018-04-24 MED ORDER — MIDAZOLAM HCL 2 MG/2ML IJ SOLN
INTRAMUSCULAR | Status: AC
  Filled 2018-04-24: qty 2

## 2018-04-24 MED ORDER — LACTATED RINGERS IV SOLN
INTRAVENOUS | Status: DC
  Administered 2018-04-24 (×3): via INTRAVENOUS

## 2018-04-24 MED ORDER — METHOCARBAMOL 500 MG PO TABS
500.0000 mg | ORAL_TABLET | Freq: Four times a day (QID) | ORAL | Status: DC | PRN
  Administered 2018-04-24 – 2018-04-26 (×5): 500 mg via ORAL
  Filled 2018-04-24 (×4): qty 1

## 2018-04-24 MED ORDER — PANTOPRAZOLE SODIUM 40 MG PO TBEC
40.0000 mg | DELAYED_RELEASE_TABLET | Freq: Every day | ORAL | Status: DC
  Administered 2018-04-24 – 2018-04-26 (×3): 40 mg via ORAL
  Filled 2018-04-24 (×3): qty 1

## 2018-04-24 SURGICAL SUPPLY — 58 items
ACETAB CUP W GRIPTION 54MM (Plate) ×1 IMPLANT
ACETAB CUP W/GRIPTION 54 (Plate) ×2 IMPLANT
BENZOIN TINCTURE PRP APPL 2/3 (GAUZE/BANDAGES/DRESSINGS) ×3 IMPLANT
BLADE CLIPPER SURG (BLADE) IMPLANT
BLADE SAW SGTL 18X1.27X75 (BLADE) ×2 IMPLANT
BLADE SAW SGTL 18X1.27X75MM (BLADE) ×1
CLOSURE WOUND 1/2 X4 (GAUZE/BANDAGES/DRESSINGS) ×2
COVER SURGICAL LIGHT HANDLE (MISCELLANEOUS) ×3 IMPLANT
COVER WAND RF STERILE (DRAPES) ×3 IMPLANT
CUP ACETAB W/GRIPTION 54 (Plate) ×1 IMPLANT
DRAPE C-ARM 42X72 X-RAY (DRAPES) ×3 IMPLANT
DRAPE STERI IOBAN 125X83 (DRAPES) ×3 IMPLANT
DRAPE U-SHAPE 47X51 STRL (DRAPES) ×9 IMPLANT
DRESSING AQUACEL AG SP 3.5X10 (GAUZE/BANDAGES/DRESSINGS) ×1 IMPLANT
DRSG AQUACEL AG ADV 3.5X10 (GAUZE/BANDAGES/DRESSINGS) ×3 IMPLANT
DRSG AQUACEL AG SP 3.5X10 (GAUZE/BANDAGES/DRESSINGS) ×3
DURAPREP 26ML APPLICATOR (WOUND CARE) ×3 IMPLANT
ELECT BLADE 4.0 EZ CLEAN MEGAD (MISCELLANEOUS) ×3
ELECT BLADE 6.5 EXT (BLADE) IMPLANT
ELECT REM PT RETURN 9FT ADLT (ELECTROSURGICAL) ×3
ELECTRODE BLDE 4.0 EZ CLN MEGD (MISCELLANEOUS) ×1 IMPLANT
ELECTRODE REM PT RTRN 9FT ADLT (ELECTROSURGICAL) ×1 IMPLANT
FACESHIELD WRAPAROUND (MASK) ×9 IMPLANT
GLOVE BIOGEL PI IND STRL 8 (GLOVE) ×2 IMPLANT
GLOVE BIOGEL PI INDICATOR 8 (GLOVE) ×4
GLOVE ECLIPSE 8.0 STRL XLNG CF (GLOVE) ×3 IMPLANT
GLOVE ORTHO TXT STRL SZ7.5 (GLOVE) ×6 IMPLANT
GOWN STRL REUS W/ TWL LRG LVL3 (GOWN DISPOSABLE) ×2 IMPLANT
GOWN STRL REUS W/ TWL XL LVL3 (GOWN DISPOSABLE) ×2 IMPLANT
GOWN STRL REUS W/TWL LRG LVL3 (GOWN DISPOSABLE) ×4
GOWN STRL REUS W/TWL XL LVL3 (GOWN DISPOSABLE) ×4
HANDPIECE INTERPULSE COAX TIP (DISPOSABLE) ×2
HEAD M SROM 36MM 2 (Hips) ×1 IMPLANT
KIT BASIN OR (CUSTOM PROCEDURE TRAY) ×3 IMPLANT
KIT TURNOVER KIT B (KITS) ×3 IMPLANT
LINER NEUTRAL 36ID 54OD (Liner) ×3 IMPLANT
MANIFOLD NEPTUNE II (INSTRUMENTS) ×3 IMPLANT
NS IRRIG 1000ML POUR BTL (IV SOLUTION) ×3 IMPLANT
PACK TOTAL JOINT (CUSTOM PROCEDURE TRAY) ×3 IMPLANT
PAD ARMBOARD 7.5X6 YLW CONV (MISCELLANEOUS) ×3 IMPLANT
SET HNDPC FAN SPRY TIP SCT (DISPOSABLE) ×1 IMPLANT
SROM M HEAD 36MM 2 (Hips) ×3 IMPLANT
STAPLER VISISTAT 35W (STAPLE) IMPLANT
STEM CORAIL KLA12 (Stem) ×3 IMPLANT
STRIP CLOSURE SKIN 1/2X4 (GAUZE/BANDAGES/DRESSINGS) ×4 IMPLANT
SUT ETHIBOND NAB CT1 #1 30IN (SUTURE) ×3 IMPLANT
SUT MNCRL AB 4-0 PS2 18 (SUTURE) IMPLANT
SUT VIC AB 0 CT1 27 (SUTURE) ×2
SUT VIC AB 0 CT1 27XBRD ANBCTR (SUTURE) ×1 IMPLANT
SUT VIC AB 1 CT1 27 (SUTURE) ×2
SUT VIC AB 1 CT1 27XBRD ANBCTR (SUTURE) ×1 IMPLANT
SUT VIC AB 2-0 CT1 27 (SUTURE) ×2
SUT VIC AB 2-0 CT1 TAPERPNT 27 (SUTURE) ×1 IMPLANT
TOWEL OR 17X24 6PK STRL BLUE (TOWEL DISPOSABLE) ×3 IMPLANT
TOWEL OR 17X26 10 PK STRL BLUE (TOWEL DISPOSABLE) ×3 IMPLANT
TRAY CATH 16FR W/PLASTIC CATH (SET/KITS/TRAYS/PACK) IMPLANT
TRAY FOLEY MTR SLVR 16FR STAT (SET/KITS/TRAYS/PACK) ×3 IMPLANT
WATER STERILE IRR 1000ML POUR (IV SOLUTION) ×6 IMPLANT

## 2018-04-24 NOTE — Op Note (Signed)
NAMLaurence Aly: Schloemer, Herchel MEDICAL RECORD [id number] ACCOUNT [id number] DATE OF BIRTH:12-18-1950 FACILITY: [facility] LOCATION: [facility] PHYSICIAN:Kamariya Blevens Aretha ParrotY. Teila Skalsky, MD  OPERATIVE REPORT  DATE OF PROCEDURE:  04/24/2018  PREOPERATIVE DIAGNOSIS:  Primary osteoarthritis and degenerative joint disease, right hip.  POSTOPERATIVE DIAGNOSIS:  Primary osteoarthritis and degenerative joint disease, right hip.  PROCEDURE:  Right total hip arthroplasty, direct anterior approach.  IMPLANTS:  DePuy Sector Gription acetabular component size 54, size 36+0 neutral polyethylene liner, size 12 Corail femoral component with standard offset, size 36, -2 metal hip ball.  SURGEON:  Vanita PandaChristopher Y. Magnus IvanBlackman, MD  ASSISTANT:  Richardean CanalGilbert Clark, PA-C  ANESTHESIA:  Spinal.  ANTIBIOTICS:  Two grams IV Ancef.  ESTIMATED BLOOD LOSS:  250 mL.  COMPLICATIONS:  None.  INDICATIONS:  The patient is a very pleasant and active [age] year old gentleman with debilitating arthritis of his right hip.  It is detrimentally affecting his activities of daily living, quality of life and his mobility.  His pain is 10/10 and is daily.   This has rapidly gotten worse over the last year.  He has tried and failed all forms of conservative treatment.  His x-rays show significant arthritis in his right hip.  His standing films show that there is a leg length discrepancy with his right  shoulder and his left but clinically he does not feel there is a leg length discrepancy and we lay him supine, he does not feel like he has a leg length discrepancy on my exam.  We talked about the risks and benefits of surgery in detail.  Informed  consent was obtained.  DESCRIPTION OF PROCEDURE:  After informed consent was obtained and appropriate right hip was marked.  He was brought to the operating room, sat up on the stretcher.  Spinal anesthesia was obtained.  He was then laid in supine position on a stretcher.  I  assessed his leg length again found them  to be equal.  A Foley catheter was placed and traction boots were placed on both his feet.  Next, he was placed supine on the Hana fracture table with a perineal post in place and both legs in line skeletal  traction device and no traction applied.  His right operative hip was prepped and draped with DuraPrep and sterile drapes.  A time-out was called to identify correct patient, correct right hip.  We then made an incision just inferior and posterior to the  anterior superior iliac spine and carried this obliquely down the leg.  We dissected down tensor fascia lata muscle.  Tensor fascia was then divided longitudinally to proceed with direct anterior approach to the hip.  We identified and cauterized  circumflex vessels and identified the hip capsule, opened the hip capsule in an L-type format finding very large joint effusion and significant periarticular osteophytes around the femoral head and neck.  We placed Cobra retractors around the medial and  lateral femoral neck and then made our femoral neck cut with an oscillating saw and completed this with an osteotome.  I placed a corkscrew guide in the femoral head and removed the femoral head in its entirety and found a wide area devoid of cartilage.   I then placed a bent Hohmann over the medial acetabular rim and removed remnants of the acetabular labrum and other debris and then began reaming under direct visualization from a size 44 reamer in stepwise increments up to a size 53 with all reamers  under direct visualization, the last reamer under direct fluoroscopy, so that I  could obtain our depth of reaming by inclination and anteversion.  I then placed the real DePuy Sector Gription acetabular component size 54 and a 36+0 polyethylene liner for  that size acetabular component.  Attention was then turned to the femur.  With the leg externally rotated to 120 degrees, extended and adducted, I was able to use a Mueller retractor medially and a Hohmann  retractor behind the greater trochanter.  I  released lateral joint capsule and used a rongeur to lateralize.  I then began broaching from a size 8 broach using the Corail broaching system going up to a size 12.  This fit the preoperative templating as well.  We trialed a varus offset femoral neck  based off his anatomy and x-rays and a 36-2 hip ball and reduced this in the acetabulum and we were pleased with leg length, offset, range of motion and stability.  Of note, his leg lengths looked longer on intraoperative films but we know that we are  matching what he looks like in real life in the preoperative templating films in the OR as well.  We then dislocated the hip and removed the trial components.  I placed the real Corail femoral component size 12 and the real 36-2 metal hip ball and  reduced this in the acetabulum.  Again, we were pleased with range of motion, offset shuck and leg length.  We then irrigated the soft tissue with normal saline solution using pulsatile lavage.  We were able to close the joint capsule with interrupted #1  Ethibond suture, followed by #1 Vicryl and tensor fascia, 0 Vicryl in the deep tissue, 2-0 Vicryl subcutaneous tissue, 4-0 Monocryl subcuticular stitch and Steri-Strips on the skin.  An Aquacel dressing was applied.  He was then taken off the Hana table  and taken to recovery room in stable condition.  All final counts were correct.  There were no complications noted.  Note, Rexene Edison, PA-C, assisted the entire case.  His assistance was crucial for facilitating all aspects of this case.  TN/NUANCE  D:04/24/2018 T:04/24/2018 JOB:[id number]

## 2018-04-24 NOTE — Anesthesia Procedure Notes (Signed)
Spinal  Patient location during procedure: OR Staffing Anesthesiologist: Eiliana Drone E, MD Performed: anesthesiologist  Preanesthetic Checklist Completed: patient identified, surgical consent, pre-op evaluation, timeout performed, IV checked, risks and benefits discussed and monitors and equipment checked Spinal Block Patient position: sitting Prep: site prepped and draped and DuraPrep Patient monitoring: continuous pulse ox, blood pressure and heart rate Approach: midline Location: L3-4 Injection technique: single-shot Needle Needle type: Pencan  Needle gauge: 24 G Needle length: 9 cm Additional Notes Functioning IV was confirmed and monitors were applied. Sterile prep and drape, including hand hygiene and sterile gloves were used. The patient was positioned and the spine was prepped. The skin was anesthetized with lidocaine.  Free flow of clear CSF was obtained prior to injecting local anesthetic into the CSF. The needle was carefully withdrawn. The patient tolerated the procedure well.      

## 2018-04-24 NOTE — Brief Op Note (Signed)
04/24/2018  1:47 PM  PATIENT:  Gerald AlyJohn Dorsey  67 y.o. male  PRE-OPERATIVE DIAGNOSIS:  osteoarthritis right hip  POST-OPERATIVE DIAGNOSIS:  osteoarthritis right hip  PROCEDURE:  Procedure(s): RIGHT TOTAL HIP ARTHROPLASTY ANTERIOR APPROACH (Right)  SURGEON:  Surgeon(s) and Role:    Kathryne Hitch* Keondria Siever Y, MD - Primary  PHYSICIAN ASSISTANT: Rexene EdisonGil Clark, PA-C  ANESTHESIA:   spinal  EBL:  250 mL   COUNTS:  YES  DICTATION: .Other Dictation: Dictation Number [id number]  PLAN OF CARE: Admit to inpatient   PATIENT DISPOSITION:  PACU - hemodynamically stable.   Delay start of Pharmacological VTE agent (>24hrs) due to surgical blood loss or risk of bleeding: no

## 2018-04-24 NOTE — H&P (Signed)
TOTAL HIP ADMISSION H&P  Patient is admitted for right total hip arthroplasty.  Subjective:  Chief Complaint: right hip pain  HPI: Gerald Dorsey, 67 y.o. male, has a history of pain and functional disability in the right hip(s) due to arthritis and patient has failed non-surgical conservative treatments for greater than 12 weeks to include NSAID's and/or analgesics, corticosteriod injections, flexibility and strengthening excercises, use of assistive devices, weight reduction as appropriate and activity modification.  Onset of symptoms was gradual starting 3 years ago with gradually worsening course since that time.The patient noted no past surgery on the right hip(s).  Patient currently rates pain in the right hip at 10 out of 10 with activity. Patient has night pain, worsening of pain with activity and weight bearing, pain that interfers with activities of daily living, pain with passive range of motion and joint swelling. Patient has evidence of subchondral sclerosis, periarticular osteophytes and joint space narrowing by imaging studies. This condition presents safety issues increasing the risk of falls.  There is no current active infection.  Patient Active Problem List   Diagnosis Date Noted  . Primary osteoarthritis of both knees 08/01/2017  . Primary osteoarthritis of right hip 06/08/2017   Past Medical History:  Diagnosis Date  . Colon polyps    Hx of colon polyps per pt report - no date  . History of kidney stones   . Hyperlipidemia   . Osteoarthritis    right hip    Past Surgical History:  Procedure Laterality Date  . COLONOSCOPY      Current Facility-Administered Medications  Medication Dose Route Frequency Provider Last Rate Last Dose  . ceFAZolin (ANCEF) IVPB 2g/100 mL premix  2 g Intravenous To SS-Surg Kathryne Hitch, MD      . chlorhexidine (HIBICLENS) 4 % liquid 4 application  60 mL Topical Once Richardean Canal W, PA-C      . lactated ringers infusion    Intravenous Continuous Lucretia Kern, MD 10 mL/hr at 04/24/18 1042    . tranexamic acid (CYKLOKAPRON) IVPB 1,000 mg  1,000 mg Intravenous To OR Kathryne Hitch, MD       No Known Allergies  Social History   Tobacco Use  . Smoking status: Never Smoker  . Smokeless tobacco: Never Used  Substance Use Topics  . Alcohol use: Not Currently    Family History  Problem Relation Age of Onset  . Hyperlipidemia Mother   . Hypertension Mother      Review of Systems  Musculoskeletal: Positive for joint pain.  All other systems reviewed and are negative.   Objective:  Physical Exam  Constitutional: He is oriented to person, place, and time. He appears well-developed and well-nourished.  HENT:  Head: Normocephalic and atraumatic.  Eyes: Pupils are equal, round, and reactive to light. EOM are normal.  Neck: Normal range of motion. Neck supple.  Cardiovascular: Normal rate and regular rhythm.  Respiratory: Effort normal and breath sounds normal.  GI: Soft. Bowel sounds are normal.  Musculoskeletal:       Right hip: He exhibits decreased range of motion, decreased strength, tenderness and bony tenderness.  Neurological: He is alert and oriented to person, place, and time.  Skin: Skin is warm and dry.  Psychiatric: He has a normal mood and affect.    Vital signs in last 24 hours: Temp:  [98.4 F (36.9 C)] 98.4 F (36.9 C) (12/03 1014) Pulse Rate:  [63] 63 (12/03 1014) Resp:  [20] 20 (12/03 1014) BP: (  159)/(81) 159/81 (12/03 1014) SpO2:  [100 %] 100 % (12/03 1014) Weight:  [108.4 kg] 108.4 kg (12/03 1014)  Labs:   Estimated body mass index is 32.41 kg/m as calculated from the following:   Height as of this encounter: 6' (1.829 m).   Weight as of this encounter: 108.4 kg.   Imaging Review Plain radiographs demonstrate severe degenerative joint disease of the right hip(s). The bone quality appears to be excellent for age and reported activity  level.    Preoperative templating of the joint replacement has been completed, documented, and submitted to the Operating Room personnel in order to optimize intra-operative equipment management.     Assessment/Plan:  End stage arthritis, right hip(s)  The patient history, physical examination, clinical judgement of the provider and imaging studies are consistent with end stage degenerative joint disease of the right hip(s) and total hip arthroplasty is deemed medically necessary. The treatment options including medical management, injection therapy, arthroscopy and arthroplasty were discussed at length. The risks and benefits of total hip arthroplasty were presented and reviewed. The risks due to aseptic loosening, infection, stiffness, dislocation/subluxation,  thromboembolic complications and other imponderables were discussed.  The patient acknowledged the explanation, agreed to proceed with the plan and consent was signed. Patient is being admitted for inpatient treatment for surgery, pain control, PT, OT, prophylactic antibiotics, VTE prophylaxis, progressive ambulation and ADL's and discharge planning.The patient is planning to be discharged home with home health services

## 2018-04-24 NOTE — Anesthesia Postprocedure Evaluation (Signed)
Anesthesia Post Note  Patient: Gerald AlyJohn Bumpus  Procedure(s) Performed: RIGHT TOTAL HIP ARTHROPLASTY ANTERIOR APPROACH (Right Hip)     Patient location during evaluation: PACU Anesthesia Type: Spinal Level of consciousness: oriented and awake and alert Pain management: pain level controlled Vital Signs Assessment: post-procedure vital signs reviewed and stable Respiratory status: spontaneous breathing, respiratory function stable and nonlabored ventilation Cardiovascular status: blood pressure returned to baseline and stable Postop Assessment: no headache, no backache, no apparent nausea or vomiting and spinal receding Anesthetic complications: no    Last Vitals:  Vitals:   04/24/18 1545 04/24/18 1615  BP: (!) 146/68 (!) 148/76  Pulse: (!) 56 65  Resp: 15 14  Temp:    SpO2: 100% 100%    Last Pain:  Vitals:   04/24/18 1615  TempSrc:   PainSc: 2                  Lucretia Kernarolyn E Jade Burkard

## 2018-04-24 NOTE — Transfer of Care (Signed)
Immediate Anesthesia Transfer of Care Note  Patient: Gerald Dorsey  Procedure(s) Performed: RIGHT TOTAL HIP ARTHROPLASTY ANTERIOR APPROACH (Right Hip)  Patient Location: PACU  Anesthesia Type:MAC and Spinal  Level of Consciousness: drowsy  Airway & Oxygen Therapy: Patient Spontanous Breathing  Post-op Assessment: Report given to RN and Post -op Vital signs reviewed and stable  Post vital signs: Reviewed and stable  Last Vitals:  Vitals Value Taken Time  BP 110/64 04/24/2018  2:05 PM  Temp    Pulse 52 04/24/2018  2:06 PM  Resp 11 04/24/2018  2:06 PM  SpO2 100 % 04/24/2018  2:06 PM  Vitals shown include unvalidated device data.  Last Pain:  Vitals:   04/24/18 1044  TempSrc:   PainSc: 0-No pain         Complications: No apparent anesthesia complications

## 2018-04-24 NOTE — Progress Notes (Signed)
Care of pt assumed by MA Valerye Kobus RN 

## 2018-04-25 ENCOUNTER — Encounter (HOSPITAL_COMMUNITY): Payer: Self-pay | Admitting: Orthopaedic Surgery

## 2018-04-25 LAB — BASIC METABOLIC PANEL
Anion gap: 10 (ref 5–15)
BUN: 11 mg/dL (ref 8–23)
CO2: 28 mmol/L (ref 22–32)
Calcium: 8.6 mg/dL — ABNORMAL LOW (ref 8.9–10.3)
Chloride: 100 mmol/L (ref 98–111)
Creatinine, Ser: 1.44 mg/dL — ABNORMAL HIGH (ref 0.61–1.24)
GFR calc Af Amer: 57 mL/min — ABNORMAL LOW (ref >60)
GFR calc non Af Amer: 50 mL/min — ABNORMAL LOW (ref >60)
Glucose, Bld: 136 mg/dL — ABNORMAL HIGH (ref 70–99)
Potassium: 4.1 mmol/L (ref 3.5–5.1)
SODIUM: 138 mmol/L (ref 135–145)

## 2018-04-25 LAB — CBC
HCT: 38.3 % — ABNORMAL LOW (ref 39.0–52.0)
Hemoglobin: 11.9 g/dL — ABNORMAL LOW (ref 13.0–17.0)
MCH: 28.3 pg (ref 26.0–34.0)
MCHC: 31.1 g/dL (ref 30.0–36.0)
MCV: 91.2 fL (ref 80.0–100.0)
Platelets: 195 10*3/uL (ref 150–400)
RBC: 4.2 MIL/uL — ABNORMAL LOW (ref 4.22–5.81)
RDW: 13.1 % (ref 11.5–15.5)
WBC: 10.4 10*3/uL (ref 4.0–10.5)
nRBC: 0 % (ref 0.0–0.2)

## 2018-04-25 NOTE — Care Management Note (Signed)
Case Management Note  Patient Details  Name: Gerald AlyJohn Dorsey MRN: [id number] Date of Birth: 1950-11-17  Subjective/Objective:     67 yr old gentleman s/p right total hip arthroplasty.               Action/Plan: Case manager spoke with patient concerning discharge plan and DME needs. Patient was preoperatively setup with [facility], no changes. He will have support at discharge. DME will be delivered to room prior to discharge.   Expected Discharge Date:   04/26/18               Expected Discharge Plan:  Home w Home Health Services  In-House Referral:  NA  Discharge planning Services  CM Consult  Post Acute Care Choice:  Durable Medical Equipment, Home Health Choice offered to:  Patient  DME Arranged:  3-N-1, Walker rolling DME Agency:  [facility]  HH Arranged:  PT HH Agency:  [facility]  Status of Service:  Completed, signed off  If discussed at [employer] Meetings, dates discussed:    Additional Comments:  Durenda GuthrieBrady, Valery Chance Naomi, RN 04/25/2018, 12:27 PM

## 2018-04-25 NOTE — Progress Notes (Signed)
Subjective: 1 Day Post-Op Procedure(s) (LRB): RIGHT TOTAL HIP ARTHROPLASTY ANTERIOR APPROACH (Right) Patient reports pain as moderate.    Objective: Vital signs in last 24 hours: Temp:  [97.9 F (36.6 C)-99.9 F (37.7 C)] 99.8 F (37.7 C) (12/04 0438) Pulse Rate:  [43-76] 74 (12/04 0438) Resp:  [14-20] 20 (12/04 0438) BP: (109-164)/(60-81) 164/73 (12/04 0438) SpO2:  [96 %-100 %] 98 % (12/04 0438) Weight:  [108.4 kg] 108.4 kg (12/03 1014)  Intake/Output from previous day: 12/03 0701 - 12/04 0700 In: 3100.2 [P.O.:600; I.V.:2500.2] Out: 750 [Urine:500; Blood:250] Intake/Output this shift: No intake/output data recorded.  Recent Labs    04/25/18 0208  HGB 11.9*   Recent Labs    04/25/18 0208  WBC 10.4  RBC 4.20*  HCT 38.3*  PLT 195   Recent Labs    04/25/18 0208  NA 138  K 4.1  CL 100  CO2 28  BUN 11  CREATININE 1.44*  GLUCOSE 136*  CALCIUM 8.6*   No results for input(s): LABPT, INR in the last 72 hours.  Sensation intact distally Intact pulses distally Dorsiflexion/Plantar flexion intact Incision: dressing C/D/I  Assessment/Plan: 1 Day Post-Op Procedure(s) (LRB): RIGHT TOTAL HIP ARTHROPLASTY ANTERIOR APPROACH (Right) Up with therapy Plan for discharge tomorrow Discharge home with home health    Kathryne HitchChristopher Y Keriana Sarsfield 04/25/2018, 8:02 AM

## 2018-04-25 NOTE — Plan of Care (Signed)
  Problem: Pain Managment: Goal: General experience of comfort will improve Outcome: Progressing   Problem: Safety: Goal: Ability to remain free from injury will improve Outcome: Progressing   

## 2018-04-25 NOTE — Evaluation (Signed)
Physical Therapy Evaluation Patient Details Name: Gerald Dorsey MRN: [id number] DOB: 1951/04/21 Today's Date: 04/25/2018   History of Present Illness  Pt is 67 y.o. male s/p right THA direct anterior approach (04/24/18). 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   Clinical Impression  PTA pt was independent with daily activities, living at home with wife, and enjoys living an active lifestyle of golfing and walking. He says his wife is recovering from recent foot injury, but reports his son lives close and is able to assist them as needed. Pt reports increased stiffness at start of treatment, completing bed mobility with min guard and all other mobility with hands on min guard for safety. he requires increased time and effort to complete functional mobility tasks, with min cuing needed for management of RW and to relax shoulders. Pt reports right hip feels better after ambulation, education provided on importance of continued mobility to help manage pain, stiffness and edema. Skilled therapy necessary to address deficits in ROM, strength, balance and endurance to improve functional mobility. PT recommending HHPT at DC to help pt return to PLOF and active lifestyle. PT will continue to follow acutely.     Follow Up Recommendations Follow surgeon's recommendation for DC plan and follow-up therapies;Supervision for mobility/OOB    Equipment Recommendations  Rolling walker with 5" wheels       Precautions / Restrictions Precautions Precautions: Fall Restrictions Weight Bearing Restrictions: Yes RLE Weight Bearing: Weight bearing as tolerated      Mobility  Bed Mobility Overal bed mobility: Needs Assistance Bed Mobility: Supine to Sit     Supine to sit: Min guard;HOB elevated     General bed mobility comments: min guard for safety, pt able to complete supine to sit with increased time and effort to manage RLE to EOB, use of bedrail to  pull self to upright.   Transfers Overall transfer level: Needs assistance Equipment used: Rolling walker (2 wheeled) Transfers: Sit to/from Stand Sit to Stand: Min guard;From elevated surface         General transfer comment: hands on min guard for safety, bed elevated, pt able to complete sit to stand with increased time and effort. vc for foot and hand placement for optimal powerup.   Ambulation/Gait Ambulation/Gait assistance: Min guard Gait Distance (Feet): 100 Feet Assistive device: Rolling walker (2 wheeled) Gait Pattern/deviations: Step-through pattern;Decreased stride length;Decreased weight shift to right   Gait velocity interpretation: 1.31 - 2.62 ft/sec, indicative of limited community ambulator General Gait Details: hands on min guard for safety, vc to relax shoulders and keep RW on the ground, pt took several standing rest breaks, reports no increase in pain, "it just feels tight"    Balance Overall balance assessment: Needs assistance Sitting-balance support: Feet supported Sitting balance-Leahy Scale: Good     Standing balance support: No upper extremity supported Standing balance-Leahy Scale: Good Standing balance comment: pt able to use bathroom and wash hands without holding on to RW.                              Pertinent Vitals/Pain Pain Assessment: Faces Faces Pain Scale: Hurts even more Pain Location: right hip  Pain Descriptors / Indicators: Tightness;Grimacing;Guarding;Sore Pain Intervention(s): Limited activity within patient's tolerance;Monitored during session;Repositioned;Patient requesting pain meds-RN notified    Home Living Family/patient expects to be discharged to:: Private residence Living Arrangements: Spouse/significant other Available Help at Discharge: Family;Available 24 hours/day;Available  PRN/intermittently Type of Home: House Home Access: Stairs to enter Entrance Stairs-Rails: Right Entrance Stairs-Number of Steps:  1 Home Layout: Multi-level;Able to live on main level with bedroom/bathroom Home Equipment: Shower seat - built in;Hand held shower head Additional Comments: Pt's wife recovering from foot injury, son and his wife live close and able to provide assistance as needed.     Prior Function Level of Independence: Independent         Comments: works for [employer], enjoys golfing and was walking 3 miles every morning before onset of hip pain.         Extremity/Trunk Assessment   Upper Extremity Assessment Upper Extremity Assessment: Defer to OT evaluation    Lower Extremity Assessment Lower Extremity Assessment: RLE deficits/detail RLE Deficits / Details: s/p right THA    Cervical / Trunk Assessment Cervical / Trunk Assessment: Normal  Communication   Communication: No difficulties  Cognition Arousal/Alertness: Awake/alert Behavior During Therapy: WFL for tasks assessed/performed Overall Cognitive Status: Within Functional Limits for tasks assessed                                        General Comments General comments (skin integrity, edema, etc.): Minimal bleeding present through incisional site dressing, pt reports no tenderness with palpation around operative site, some edema and reports of tightness.     Exercises Total Joint Exercises Ankle Circles/Pumps: AROM;Both;10 reps;Seated   Assessment/Plan    PT Assessment Patient needs continued PT services  PT Problem List Decreased strength;Decreased range of motion;Decreased activity tolerance;Decreased balance;Decreased mobility;Decreased knowledge of use of DME;Pain       PT Treatment Interventions DME instruction;Gait training;Stair training;Functional mobility training;Therapeutic activities;Therapeutic exercise;Balance training;Neuromuscular re-education;Patient/family education    PT Goals (Current goals can be found in the Care Plan section)  Acute Rehab PT Goals Patient  Stated Goal: go home PT Goal Formulation: With patient Time For Goal Achievement: 05/09/18 Potential to Achieve Goals: Good    Frequency 7X/week    AM-PAC PT "6 Clicks" Mobility  Outcome Measure Help needed turning from your back to your side while in a flat bed without using bedrails?: A Lot Help needed moving from lying on your back to sitting on the side of a flat bed without using bedrails?: A Lot Help needed moving to and from a bed to a chair (including a wheelchair)?: A Little Help needed standing up from a chair using your arms (e.g., wheelchair or bedside chair)?: A Little Help needed to walk in hospital room?: A Little Help needed climbing 3-5 steps with a railing? : A Little 6 Click Score: 16    End of Session Equipment Utilized During Treatment: Gait belt Activity Tolerance: Patient tolerated treatment well Patient left: in chair;with call bell/phone within reach Nurse Communication: Patient requests pain meds;Mobility status PT Visit Diagnosis: Other abnormalities of gait and mobility (R26.89);Muscle weakness (generalized) (M62.81);Difficulty in walking, not elsewhere classified (R26.2);Pain Pain - Right/Left: Right Pain - part of body: Hip    Time: 0957-1030 PT Time Calculation (min) (ACUTE ONLY): 33 min   Charges:   PT Evaluation $PT Eval Low Complexity: 1 Low PT Treatments $Gait Training: 8-22 mins        Rinaldo Cloudourtney Janace Decker, SPT Acute Rehabilitation Services Office [phone]   Rinaldo CloudCourtney Sharicka Pogorzelski 04/25/2018, 11:21 AM

## 2018-04-25 NOTE — Evaluation (Signed)
Occupational Therapy Evaluation Patient Details Name: Gerald Dorsey MRN: [id number] DOB: 09-07-1950 Today's Date: 04/25/2018    History of Present Illness Pt is 67 y.o. male s/p right THA direct anterior approach (04/24/18). 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    Clinical Impression   PTA Pt independent, works full time, drives, had been walking 3 miles a day prior to hip pain - plays golf. Pt is currently min guard assist for transfers from low surfaces, mod A for LB ADL - which his wife and son can complete. Verbally educated in AE for LB ADL. Pt able to complete standing grooming at sink without UE support on RW. Spent special time educating Pt and family on 3 in 1 and all its uses as well as shower safety. OT educated on sequencing for dressing, other compensatory strategies. Education complete. Pt, wife and son verbalize understanding. OT to sign off at this time. Thank you for the opportunity to serve this patient.     Follow Up Recommendations  No OT follow up;Follow surgeon's recommendation for DC plan and follow-up therapies    Equipment Recommendations  3 in 1 bedside commode    Recommendations for Other Services       Precautions / Restrictions Precautions Precautions: Fall Restrictions Weight Bearing Restrictions: Yes RLE Weight Bearing: Weight bearing as tolerated      Mobility Bed Mobility               General bed mobility comments: in recliner at beginning and end of session  Transfers Overall transfer level: Needs assistance Equipment used: Rolling walker (2 wheeled) Transfers: Sit to/from Stand Sit to Stand: Min guard;Min assist         General transfer comment: min guard assist for boost and balance. good hand placement after initial transfer    Balance Overall balance assessment: Needs assistance Sitting-balance support: Feet supported Sitting balance-Leahy Scale: Good     Standing  balance support: No upper extremity supported Standing balance-Leahy Scale: Good Standing balance comment: able to perform sink level grooming with out UE support at sink                           ADL either performed or assessed with clinical judgement   ADL Overall ADL's : Needs assistance/impaired Eating/Feeding: Independent   Grooming: Min guard;Standing   Upper Body Bathing: Set up;Sitting   Lower Body Bathing: Moderate assistance;With caregiver independent assisting;Sitting/lateral leans Lower Body Bathing Details (indicate cue type and reason): educated on long handle sponge Upper Body Dressing : Set up;Sitting   Lower Body Dressing: Moderate assistance;With caregiver independent assisting;Sit to/from stand Lower Body Dressing Details (indicate cue type and reason): educated on reacher/grabber - "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, commercial Transfer: Min guard;Minimal assistance;Ambulation;RW Toilet Transfer Details (indicate cue type and reason): initial min guard assist for boost and balance - vc for safe hand placement Toileting- Clothing Manipulation and Hygiene: Minimal assistance;With caregiver independent assisting;Sit to/from stand   Tub/ Shower Transfer: Walk-in shower;Min guard;Ambulation;3 in 1;Shower seat   Functional mobility during ADLs: Min guard;Rolling walker       Vision Patient Visual Report: No change from baseline       Perception     Praxis      Pertinent Vitals/Pain Pain Assessment: Faces Faces Pain Scale: Hurts a little bit Pain Location: right hip  Pain Descriptors / Indicators: Tightness;Grimacing;Guarding Pain Intervention(s): Monitored during session;Repositioned  Hand Dominance     Extremity/Trunk Assessment Upper Extremity Assessment Upper Extremity Assessment: Overall WFL for tasks assessed   Lower Extremity Assessment Lower Extremity Assessment: Defer to PT evaluation   Cervical / Trunk Assessment Cervical / Trunk  Assessment: Normal   Communication Communication Communication: No difficulties   Cognition Arousal/Alertness: Awake/alert Behavior During Therapy: WFL for tasks assessed/performed Overall Cognitive Status: Within Functional Limits for tasks assessed                                     General Comments  Pt's wife and son present for education and session    Exercises Exercises: Total Joint Total Joint Exercises Ankle Circles/Pumps: AROM;Both;10 reps;Seated Heel Slides: AROM;Right;10 reps;Seated(long sitting and seated.) Hip ABduction/ADduction: AAROM;Right;5 reps;Seated(long sitting) Straight Leg Raises: AAROM;Right;5 reps;Seated(long sitting) Long Arc Quad: AROM;Right;10 reps;Seated   Shoulder Instructions      Home Living Family/patient expects to be discharged to:: Private residence Living Arrangements: Spouse/significant other Available Help at Discharge: Family;Available 24 hours/day;Available PRN/intermittently Type of Home: House Home Access: Stairs to enter Entergy CorporationEntrance Stairs-Number of Steps: 1 Entrance Stairs-Rails: Right Home Layout: Multi-level;Able to live on main level with bedroom/bathroom Alternate Level Stairs-Number of Steps: 12   Bathroom Shower/Tub: Producer, television/film/videoWalk-in shower   Bathroom Toilet: Standard     Home Equipment: Shower seat - built in;Hand held shower head   Additional Comments: Pt's wife recovering from foot injury, son and his wife live close and able to provide assistance as needed.       Prior Functioning/Environment Level of Independence: Independent        Comments: works for [employer], enjoys golfing and was walking 3 miles every morning before onset of hip pain. , enjoys golf        OT Problem List: Decreased activity tolerance;Impaired balance (sitting and/or standing)      OT Treatment/Interventions:      OT Goals(Current goals can be found in the care plan section) Acute Rehab OT Goals Patient  Stated Goal: get back to playing golf OT Goal Formulation: With patient/family Time For Goal Achievement: 04/25/18 Potential to Achieve Goals: Good  OT Frequency:     Barriers to D/C:            Co-evaluation              AM-PAC OT "6 Clicks" Daily Activity     Outcome Measure Help from another person eating meals?: None Help from another person taking care of personal grooming?: None Help from another person toileting, which includes using toliet, bedpan, or urinal?: None Help from another person bathing (including washing, rinsing, drying)?: A Lot Help from another person to put on and taking off regular upper body clothing?: None Help from another person to put on and taking off regular lower body clothing?: A Lot 6 Click Score: 20   End of Session Equipment Utilized During Treatment: Gait belt;Rolling walker Nurse Communication: Mobility status  Activity Tolerance: Patient tolerated treatment well Patient left: in chair;with call bell/phone within reach;with family/visitor present  OT Visit Diagnosis: Unsteadiness on feet (R26.81);Other abnormalities of gait and mobility (R26.89)                Time: [id number] OT Time Calculation (min): 12 min Charges:  OT General Charges $OT Visit: 1 Visit OT Evaluation $OT Eval Moderate Complexity: 1 Mod  Sherryl MangesLaura Richie Vadala OTR/L Acute Rehabilitation Services Pager: [phone] Office: [phone]  Evern Bio Kaiyu Mirabal 04/25/2018, 4:39 PM

## 2018-04-25 NOTE — Progress Notes (Signed)
Physical Therapy Treatment Patient Details Name: Gerald AlyJohn Dorsey MRN: [id number] DOB: August 25, 1950 Today's Date: 04/25/2018    History of Present Illness Pt is 67 y.o. male s/p right THA direct anterior approach (04/24/18). 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     PT Comments    Pt asleep in chair at start of treatment, agreeable to therapy. He reports he has moved some since therapy this morning, but was very stiff at beginning of mobility. Educated on seated and supine HEP with proper form. Pt completing all mobility hands on min guard progressing to min guard for safety, ambulating farther distance than he did this morning. DC plan remains appropriate pending safety with stair training tomorrow. PT will continue to follow acutely.   Follow Up Recommendations  Follow surgeon's recommendation for DC plan and follow-up therapies;Supervision for mobility/OOB     Equipment Recommendations  Rolling walker with 5" wheels       Precautions / Restrictions Precautions Precautions: Fall Restrictions Weight Bearing Restrictions: Yes RLE Weight Bearing: Weight bearing as tolerated    Mobility  Transfers Overall transfer level: Needs assistance Equipment used: Rolling walker (2 wheeled) Transfers: Sit to/from Stand Sit to Stand: Min guard;From elevated surface         General transfer comment: hands on min guard for safety, pt completed sit to stand with proper use of RW, increased time and effort with some right knee valgus.   Ambulation/Gait Ambulation/Gait assistance: Min guard Gait Distance (Feet): 160 Feet Assistive device: Rolling walker (2 wheeled) Gait Pattern/deviations: Step-through pattern;Decreased stride length;Decreased weight shift to right   Gait velocity interpretation: 1.31 - 2.62 ft/sec, indicative of limited community ambulator General Gait Details: hands on min guard for safety, vc to relax shoulders and keep  RW on the ground. Ambulates with decreased weight shift and stance time on RLE but reports no increase in pain, "it just feels tight". Pt reports stiffness decreased following ambulation.      Balance Overall balance assessment: Needs assistance Sitting-balance support: Feet supported Sitting balance-Leahy Scale: Good     Standing balance support: No upper extremity supported Standing balance-Leahy Scale: Good Standing balance comment: pt able to use bathroom and wash hands without holding on to RW.                             Cognition Arousal/Alertness: Awake/alert Behavior During Therapy: WFL for tasks assessed/performed Overall Cognitive Status: Within Functional Limits for tasks assessed                                        Exercises Total Joint Exercises Ankle Circles/Pumps: AROM;Both;10 reps;Seated Heel Slides: AROM;Right;10 reps;Seated(long sitting and seated.) Hip ABduction/ADduction: AAROM;Right;5 reps;Seated(long sitting) Straight Leg Raises: AAROM;Right;5 reps;Seated(long sitting) Long Arc Quad: AROM;Right;10 reps;Seated All exercises completed in decreased ROM, limited by pain, weakness and edema. Educated to work in tolerable range to prevent compensatory movements when completing exercises.   General Comments General comments (skin integrity, edema, etc.): Pt's wife present during treatment. Pt educated on healing and importance of mobility every hour to decrease stiffness. Encouraged pt to ambulate with staff as able to help with stiffness.      Pertinent Vitals/Pain Pain Assessment: Faces Faces Pain Scale: Hurts little more Pain Location: right hip  Pain Descriptors / Indicators: Tightness;Grimacing;Guarding Pain Intervention(s): Limited activity within  patient's tolerance;Monitored during session;Premedicated before session           PT Goals (current goals can now be found in the care plan section) Acute Rehab PT  Goals Patient Stated Goal: go home PT Goal Formulation: With patient Time For Goal Achievement: 05/09/18 Potential to Achieve Goals: Good    Frequency    7X/week      PT Plan Current plan remains appropriate       AM-PAC PT "6 Clicks" Mobility   Outcome Measure  Help needed turning from your back to your side while in a flat bed without using bedrails?: A Lot Help needed moving from lying on your back to sitting on the side of a flat bed without using bedrails?: A Lot Help needed moving to and from a bed to a chair (including a wheelchair)?: A Little Help needed standing up from a chair using your arms (e.g., wheelchair or bedside chair)?: A Little Help needed to walk in hospital room?: A Little Help needed climbing 3-5 steps with a railing? : A Little 6 Click Score: 16    End of Session Equipment Utilized During Treatment: Gait belt Activity Tolerance: Patient tolerated treatment well Patient left: in chair;with call bell/phone within reach   PT Visit Diagnosis: Other abnormalities of gait and mobility (R26.89);Muscle weakness (generalized) (M62.81);Difficulty in walking, not elsewhere classified (R26.2);Pain Pain - Right/Left: Right Pain - part of body: Hip     Time: 1610-9604 PT Time Calculation (min) (ACUTE ONLY): 29 min  Charges:  $Gait Training: 8-22 mins $Therapeutic Exercise: 8-22 mins                     Rinaldo Cloud, SPT Acute Rehabilitation Services Office [phone]    Rinaldo Cloud 04/25/2018, 4:23 PM

## 2018-04-26 MED ORDER — METHOCARBAMOL 500 MG PO TABS: 500.0000 mg | ORAL_TABLET | Freq: Four times a day (QID) | ORAL | 1 refills | Status: DC | PRN

## 2018-04-26 MED ORDER — ASPIRIN 81 MG PO CHEW: 81.0000 mg | CHEWABLE_TABLET | Freq: Two times a day (BID) | ORAL | 0 refills | Status: DC

## 2018-04-26 MED ORDER — OXYCODONE HCL 5 MG PO TABS: 5.0000 mg | ORAL_TABLET | ORAL | 0 refills | Status: DC | PRN

## 2018-04-26 NOTE — Progress Notes (Signed)
Physical Therapy Treatment Patient Details Name: Gerald Dorsey MRN: [id number] DOB: 03/11/1951 Today's Date: 04/26/2018    History of Present Illness Pt is 67 y.o. male s/p right THA direct anterior approach (04/24/18). 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     PT Comments    Pt eager to get up with therapy today. Initiated stair training today, he was able to ascend/descend stairs with use of handrails and proper sequencing with min cuing. Pt reports he has been completing HEP and was able to properly demonstrate some seated/supine exercises. Educated on healing time, safety with car transfer, and importance of mobility/HEP for return to PLOF. DC plan remains appropriate at this time, PT will see once more this afternoon prior to DC.   Follow Up Recommendations  Follow surgeon's recommendation for DC plan and follow-up therapies;Supervision for mobility/OOB     Equipment Recommendations  Rolling walker with 5" wheels       Precautions / Restrictions Precautions Precautions: Fall Restrictions Weight Bearing Restrictions: Yes RLE Weight Bearing: Weight bearing as tolerated    Mobility  Bed Mobility               General bed mobility comments: sitting on EOB at start of treatment  Transfers Overall transfer level: Needs assistance Equipment used: Rolling walker (2 wheeled) Transfers: Sit to/from Stand Sit to Stand: Min guard         General transfer comment: hands on min guard for safety, pt completed sit to stand with proper use of RW.   Ambulation/Gait Ambulation/Gait assistance: Min guard Gait Distance (Feet): 220 Feet Assistive device: Rolling walker (2 wheeled) Gait Pattern/deviations: Step-through pattern;Decreased stride length;Decreased weight shift to right   Gait velocity interpretation: 1.31 - 2.62 ft/sec, indicative of limited community ambulator General Gait Details: hands on min guard for  safety, vc to relax shoulders and keep RW on the ground. Ambulates with decreased weight shift and stance time on RLE which improves as pt ambulates longer distances.     Stairs Stairs: Yes Stairs assistance: Min guard Stair Management: Two rails;Step to pattern Number of Stairs: 5(2 regular height, 3 shortened height) General stair comments: hands on min guard for safety, pt able to navigate stairs with proper sequencing and use of both hand rails as pt reports he is able to hold on to door frame during stair navigation at home.       Balance Overall balance assessment: Needs assistance Sitting-balance support: Feet supported Sitting balance-Leahy Scale: Good     Standing balance support: No upper extremity supported Standing balance-Leahy Scale: Good Standing balance comment: pt able to brush teeth at sink without holding on to RW.                             Cognition Arousal/Alertness: Awake/alert Behavior During Therapy: WFL for tasks assessed/performed Overall Cognitive Status: Within Functional Limits for tasks assessed                                        Exercises Total Joint Exercises Heel Slides: (long sitting and seated.) Hip ABduction/ADduction: (long sitting) Straight Leg Raises: (long sitting)    General Comments General comments (skin integrity, edema, etc.): Nursing present at start of treatment. Pt able to demonstrate some of his seated/supine HEP that he reports he has been doing  since therapy yesterday.       Pertinent Vitals/Pain Pain Assessment: Faces Faces Pain Scale: Hurts little more Pain Location: right hip  Pain Descriptors / Indicators: Tightness;Grimacing;Guarding Pain Intervention(s): Limited activity within patient's tolerance;Monitored during session;RN gave pain meds during session;Repositioned           PT Goals (current goals can now be found in the care plan section) Acute Rehab PT Goals Patient  Stated Goal: go home PT Goal Formulation: With patient Time For Goal Achievement: 05/09/18 Potential to Achieve Goals: Good Progress towards PT goals: Progressing toward goals    Frequency    7X/week      PT Plan Current plan remains appropriate       AM-PAC PT "6 Clicks" Mobility   Outcome Measure  Help needed turning from your back to your side while in a flat bed without using bedrails?: A Little Help needed moving from lying on your back to sitting on the side of a flat bed without using bedrails?: A Little Help needed moving to and from a bed to a chair (including a wheelchair)?: A Little Help needed standing up from a chair using your arms (e.g., wheelchair or bedside chair)?: A Little Help needed to walk in hospital room?: A Little Help needed climbing 3-5 steps with a railing? : A Little 6 Click Score: 18    End of Session Equipment Utilized During Treatment: Gait belt Activity Tolerance: Patient tolerated treatment well Patient left: in chair;with call bell/phone within reach Nurse Communication: Mobility status PT Visit Diagnosis: Other abnormalities of gait and mobility (R26.89);Muscle weakness (generalized) (M62.81);Difficulty in walking, not elsewhere classified (R26.2);Pain Pain - Right/Left: Right Pain - part of body: Hip     Time: 1610-9604 PT Time Calculation (min) (ACUTE ONLY): 30 min  Charges:  $Gait Training: 23-37 mins                     Rinaldo Cloud, SPT Acute Rehabilitation Services Office [phone]    Rinaldo Cloud 04/26/2018, 12:00 PM

## 2018-04-26 NOTE — Discharge Summary (Signed)
Patient ID: Gerald Dorsey MRN: [id number] DOB/AGE: 1950/10/22 67 y.o.  Admit date: 04/24/2018 Discharge date: 04/26/2018  Admission Diagnoses:  Principal Problem:   Primary osteoarthritis of right hip Active Problems:   Status post total replacement of right hip   Discharge Diagnoses:  Same  Past Medical History:  Diagnosis Date  . Colon polyps    Hx of colon polyps per pt report - no date  . History of kidney stones   . Hyperlipidemia   . Osteoarthritis    right hip    Surgeries: Procedure(s): RIGHT TOTAL HIP ARTHROPLASTY ANTERIOR APPROACH on 04/24/2018   Consultants:   Discharged Condition: Improved  Hospital Course: Gerald Dorsey is an 67 y.o. male who was admitted 04/24/2018 for operative treatment ofPrimary osteoarthritis of right hip. Patient has severe unremitting pain that affects sleep, daily activities, and work/hobbies. After pre-op clearance the patient was taken to the operating room on 04/24/2018 and underwent  Procedure(s): RIGHT TOTAL HIP ARTHROPLASTY ANTERIOR APPROACH.    Patient was given perioperative antibiotics:  Anti-infectives (From admission, onward)   Start     Dose/Rate Route Frequency Ordered Stop   04/24/18 1100  ceFAZolin (ANCEF) IVPB 2g/100 mL premix     2 g 200 mL/hr over 30 Minutes Intravenous To ShortStay Surgical 04/23/18 1400 04/24/18 1225       Patient was given sequential compression devices, early ambulation, and chemoprophylaxis to prevent DVT.  Patient benefited maximally from hospital stay and there were no complications.    Recent vital signs:  Patient Vitals for the past 24 hrs:  BP Temp Temp src Pulse Resp SpO2  04/26/18 0339 (!) 156/72 100 F (37.8 C) Oral - 16 -  04/25/18 2105 (!) 162/78 100.3 F (37.9 C) Oral 90 16 97 %  04/25/18 1543 117/61 98.3 F (36.8 C) Oral 81 17 93 %  04/25/18 1424 - (!) 101.4 F (38.6 C) Oral - - -     Recent laboratory studies:  Recent Labs    04/25/18 0208  WBC 10.4  HGB 11.9*  HCT  38.3*  PLT 195  NA 138  K 4.1  CL 100  CO2 28  BUN 11  CREATININE 1.44*  GLUCOSE 136*  CALCIUM 8.6*     Discharge Medications:   Allergies as of 04/26/2018   No Known Allergies     Medication List    TAKE these medications   aspirin 81 MG chewable tablet Chew 1 tablet (81 mg total) by mouth 2 (two) times daily.   celecoxib 200 MG capsule Commonly known as:  CELEBREX Take 200 mg by mouth daily.   Coenzyme Q10 300 MG Caps Take 300 mg by mouth 2 (two) times a week.   Fish Oil 1200 MG Caps Take 1,200 mg by mouth 2 (two) times a week.   guaiFENesin 600 MG 12 hr tablet Commonly known as:  MUCINEX Take 600 mg by mouth daily as needed (congestion).   methocarbamol 500 MG tablet Commonly known as:  ROBAXIN Take 1 tablet (500 mg total) by mouth every 6 (six) hours as needed for muscle spasms.   oxyCODONE 5 MG immediate release tablet Commonly known as:  Oxy IR/ROXICODONE Take 1-2 tablets (5-10 mg total) by mouth every 4 (four) hours as needed for moderate pain (pain score 4-6).            Durable Medical Equipment  (From admission, onward)         Start     Ordered   04/24/18 1746  DME 3 n 1  Once     04/24/18 1745   04/24/18 1746  DME Walker rolling  Once    Question:  Patient needs a walker to treat with the following condition  Answer:  Status post total replacement of right hip   04/24/18 1745          Diagnostic Studies: Dg Pelvis Portable  Result Date: 04/24/2018 CLINICAL DATA:  67 year old male post right hip replacement. Subsequent encounter. EXAM: PORTABLE PELVIS 1-2 VIEWS COMPARISON:  Intraoperative imaging 04/24/2018. Preoperative MR 11/03/2017. FINDINGS: Post total right hip replacement which appears in satisfactory position without complication noted on frontal projection. Mild left hip joint degenerative changes. IMPRESSION: Post total right hip replacement. Electronically Signed   By: Lacy Duverney M.D.   On: 04/24/2018 14:27   Dg C-arm 1-60  Min  Result Date: 04/24/2018 CLINICAL DATA:  Right total hip replacement EXAM: DG C-ARM 61-120 MIN COMPARISON:  Pelvis and right hip films of 02/07/2018 FINDINGS: C-arm fluoroscopy was provided during right hip replacement. Fluoroscopy time of 40 seconds was recorded. IMPRESSION: C-arm fluoroscopy provided. Electronically Signed   By: Dwyane Dee M.D.   On: 04/24/2018 13:51   Dg Hip Operative Unilat W Or W/o Pelvis Right  Result Date: 04/24/2018 CLINICAL DATA:  Postop right hip replacement EXAM: OPERATIVE right HIP (WITH PELVIS IF PERFORMED) four VIEWS TECHNIQUE: Fluoroscopic spot image(s) were submitted for interpretation post-operatively. COMPARISON:  Pelvis and right hip films of 02/07/2018 FINDINGS: The acetabular and femoral components of the right hip replacement are in good position. No complicating features are seen. IMPRESSION: Right total hip replacement components in good position with no complicating features. Electronically Signed   By: Dwyane Dee M.D.   On: 04/24/2018 13:50    Disposition: Discharge disposition: 01-Home or Self Care         Follow-up Information    [facility] Follow up.   Specialty:  Home Health Services Why:  A representative from [facility] will contact you to arrange start date and time for your therapy. Contact information: [address] [address] [place] [address] [phone]        Kathryne Hitch, MD. Call in 2 week(s).   Specialty:  Orthopedic Surgery Contact information: [address] [place] [address] [phone]            Signed: Kathryne Hitch 04/26/2018, 12:38 PM

## 2018-04-26 NOTE — Discharge Instructions (Signed)

## 2018-04-26 NOTE — Progress Notes (Signed)
Patient ID: Gerald Dorsey, male   DOB: 05/17/1951, 67 y.o.   MRN: [id number] Doing well overall.  Can go to home today.

## 2018-04-27 ENCOUNTER — Telehealth (INDEPENDENT_AMBULATORY_CARE_PROVIDER_SITE_OTHER): Payer: Self-pay | Admitting: Orthopaedic Surgery

## 2018-04-27 NOTE — Telephone Encounter (Signed)
That will be fine. 

## 2018-04-27 NOTE — Telephone Encounter (Signed)
Ok

## 2018-04-27 NOTE — Telephone Encounter (Signed)
[facility]  [place]  [phone]    Verbal orders  1 week 1  3 week 1  1 week 1

## 2018-04-30 NOTE — Telephone Encounter (Signed)
Returned call to [place] with verbal orders

## 2018-05-07 ENCOUNTER — Encounter (INDEPENDENT_AMBULATORY_CARE_PROVIDER_SITE_OTHER): Payer: Self-pay | Admitting: Orthopaedic Surgery

## 2018-05-07 ENCOUNTER — Ambulatory Visit (INDEPENDENT_AMBULATORY_CARE_PROVIDER_SITE_OTHER): Payer: Medicare Other | Admitting: Orthopaedic Surgery

## 2018-05-07 DIAGNOSIS — Z96641 Presence of right artificial hip joint: Secondary | ICD-10-CM

## 2018-05-07 MED ORDER — OXYCODONE HCL 5 MG PO TABS: 5.0000 mg | ORAL_TABLET | ORAL | 0 refills | Status: DC | PRN

## 2018-05-07 NOTE — Progress Notes (Signed)
The patient is now 2 weeks tomorrow status post a right total hip arthroplasty.  He is doing well overall without any significant issues.  He is ambulating with a walker.  He is ready to transition to a cane.  On exam his incision actually is good.  I removed this old Steri-Strips in place new wounds.  There is no evidence of infection at all.  His leg lengths are equal.  At this point he will continue increase his activities as comfort allows.  I will send in a few more oxycodone for him.  All question concerns were answered and addressed.  We will see him back in 4 weeks for repeat follow-up but no x-rays are needed.

## 2018-06-04 ENCOUNTER — Encounter (INDEPENDENT_AMBULATORY_CARE_PROVIDER_SITE_OTHER): Payer: Self-pay | Admitting: Orthopaedic Surgery

## 2018-06-04 ENCOUNTER — Ambulatory Visit (INDEPENDENT_AMBULATORY_CARE_PROVIDER_SITE_OTHER): Payer: Medicare Other | Admitting: Orthopaedic Surgery

## 2018-06-04 DIAGNOSIS — Z96641 Presence of right artificial hip joint: Secondary | ICD-10-CM

## 2018-06-04 NOTE — Progress Notes (Signed)
The patient is now 6 weeks status post a right total hip arthroplasty.  He has no issues at all he states.  He is walking without any significant limp.  He does have some stiffness in his right hip incision looks great.  His leg lengths are equal.  He tolerates me easily putting his hip to internal extra rotation.  A long and thorough discussion about him getting back to his regular workout routine and I am fine with that.  He can call from my standpoint as well.  He is going on a trip to [place] coming up and I do not feel he needs compressive socks for the plane flight less he wants to wear them but as long as he is pumping his feet he should be fine for short type of trip.  All question concerns were answered and addressed.  We will see him back in 6 months and I would like a standing low AP pelvis and a lateral of his right operative hip.

## 2018-08-06 ENCOUNTER — Telehealth (INDEPENDENT_AMBULATORY_CARE_PROVIDER_SITE_OTHER): Payer: Self-pay | Admitting: Radiology

## 2018-08-06 NOTE — Telephone Encounter (Signed)
Faxed to provided number  

## 2018-08-06 NOTE — Telephone Encounter (Signed)
[name], A. Donnamarie Rossetti, DDS, called requesting pre medication instructions for dental work. Patient is status post right total hip replacement on 04/24/2018.  She rescheduled patient's appointment to 4pm tomorrow, and will need something in writing faxed to her if patient does not require pre-med.  Please advise.  CB [phone] Fax [phone]

## 2018-12-03 ENCOUNTER — Telehealth: Payer: Self-pay

## 2018-12-03 ENCOUNTER — Ambulatory Visit (INDEPENDENT_AMBULATORY_CARE_PROVIDER_SITE_OTHER): Payer: Medicare Other | Admitting: Orthopaedic Surgery

## 2018-12-03 ENCOUNTER — Ambulatory Visit (INDEPENDENT_AMBULATORY_CARE_PROVIDER_SITE_OTHER): Payer: Medicare Other

## 2018-12-03 ENCOUNTER — Other Ambulatory Visit: Payer: Self-pay

## 2018-12-03 ENCOUNTER — Encounter: Payer: Self-pay | Admitting: Orthopaedic Surgery

## 2018-12-03 DIAGNOSIS — M25562 Pain in left knee: Secondary | ICD-10-CM

## 2018-12-03 DIAGNOSIS — Z96641 Presence of right artificial hip joint: Secondary | ICD-10-CM

## 2018-12-03 DIAGNOSIS — M1712 Unilateral primary osteoarthritis, left knee: Secondary | ICD-10-CM | POA: Insufficient documentation

## 2018-12-03 DIAGNOSIS — G8929 Other chronic pain: Secondary | ICD-10-CM | POA: Diagnosis not present

## 2018-12-03 MED ORDER — METHYLPREDNISOLONE ACETATE 40 MG/ML IJ SUSP
40.0000 mg | INTRAMUSCULAR | Status: AC | PRN
  Administered 2018-12-03: 40 mg via INTRA_ARTICULAR

## 2018-12-03 MED ORDER — LIDOCAINE HCL 1 % IJ SOLN
3.0000 mL | INTRAMUSCULAR | Status: AC | PRN
  Administered 2018-12-03: 3 mL

## 2018-12-03 NOTE — Telephone Encounter (Signed)
Noted  

## 2018-12-03 NOTE — Telephone Encounter (Signed)
Left knee gel injections  

## 2018-12-03 NOTE — Progress Notes (Signed)
Office Visit Note   Patient: Gerald Dorsey           Date of Birth: 1950-12-31           MRN: [id number] Visit Date: 12/03/2018              Requested by: [name], [name], NP [address] [place] [id number] PCP: [name], [name], NP   Assessment & Plan: Visit Diagnoses:  1. History of right hip replacement   2. Chronic pain of left knee   3. Unilateral primary osteoarthritis, left knee     Plan: As far as his hip goes, he is doing well and follow-up can be as needed however he does have a painful osteoarthritic left knee.  He is the perfect candidate for steroid injection today in that left knee followed by hyaluronic acid.  I explained the rationale behind this as well as the risk and benefits involved.  I gave him a handout.  He still has joint space remaining so I do not feel that he should go straight to knee replacement nor does he.  I think that injections such as a steroid followed by hyaluronic acid.  Give him a lot of life in that knee.  All question concerns were answered and addressed.  He tolerated the steroid injection well in his left knee.  We will see him back in 5 weeks to place hyaluronic acid injection in the left knee.  Follow-Up Instructions: Return in about 5 weeks (around 01/07/2019).   Orders:  Orders Placed This Encounter  Procedures  . Large Joint Inj  . XR HIP UNILAT W OR W/O PELVIS 1V RIGHT   No orders of the defined types were placed in this encounter.     Procedures: Large Joint Inj: L knee on 12/03/2018 9:06 AM Indications: diagnostic evaluation and pain Details: 22 G 1.5 in needle, superolateral approach  Arthrogram: No  Medications: 3 mL lidocaine 1 %; 40 mg methylPREDNISolone acetate 40 MG/ML Outcome: tolerated well, no immediate complications Procedure, treatment alternatives, risks and benefits explained, specific risks discussed. Consent was given by the patient. Immediately prior to procedure a time out was called to verify the  correct patient, procedure, equipment, support staff and site/side marked as required. Patient was prepped and draped in the usual sterile fashion.       Clinical Data: No additional findings.   Subjective: Chief Complaint  Patient presents with  . Right Hip - Follow-up  The patient is well-known to me.  He is a very pleasant 68 year old gentleman who is an avid golfer who we replaced his right hip 7 months ago.  The hip is doing well.  He has known osteoarthritis of his left knee.  He started to have knee pain when he is playing golf.  It is mainly on the medial joint line.  X-rays of his left knee in March 2019 shows medial joint space narrowing with varus malalignment.  He says that is what bothers him more than his hip.  He denies any other acute changes in medical status.  He is been doing well overall.  HPI  Review of Systems He currently denies any headache, chest pain, shortness of breath, fever, chills, nausea, vomiting  Objective: Vital Signs: There were no vitals taken for this visit.  Physical Exam He is alert and orient x3 and in no acute distress Ortho Exam Examination of his right hip shows that he moves fluidly.  His leg lengths are equal.  Examination  of his left knee shows no effusion but medial joint line tenderness.  He is got good flexion-extension of his left knee and it is ligamentously stable. Specialty Comments:  No specialty comments available.  Imaging: Xr Hip Unilat W Or W/o Pelvis 1v Right  Result Date: 12/03/2018 An AP pelvis and lateral of the right hip shows a well-seated total hip arthroplasty with no complicating features.    PMFS History: Patient Active Problem List   Diagnosis Date Noted  . Unilateral primary osteoarthritis, left knee 12/03/2018  . Status post total replacement of right hip 04/24/2018  . Primary osteoarthritis of both knees 08/01/2017  . Primary osteoarthritis of right hip 06/08/2017   Past Medical History:  Diagnosis  Date  . Colon polyps    Hx of colon polyps per pt report - no date  . History of kidney stones   . Hyperlipidemia   . Osteoarthritis    right hip    Family History  Problem Relation Age of Onset  . Hyperlipidemia Mother   . Hypertension Mother     Past Surgical History:  Procedure Laterality Date  . COLONOSCOPY    . TOTAL HIP ARTHROPLASTY Right 04/24/2018   Procedure: RIGHT TOTAL HIP ARTHROPLASTY ANTERIOR APPROACH;  Surgeon: Mcarthur Rossetti, MD;  Location: [facility];  Service: Orthopedics;  Laterality: Right;   Social History   Occupational History  . Not on file  Tobacco Use  . Smoking status: Never Smoker  . Smokeless tobacco: Never Used  Substance and Sexual Activity  . Alcohol use: Not Currently  . Drug use: Never  . Sexual activity: Not on file

## 2018-12-04 ENCOUNTER — Telehealth: Payer: Self-pay

## 2018-12-04 NOTE — Telephone Encounter (Signed)
Submitted VOB for Monovisc, left knee. 

## 2018-12-07 ENCOUNTER — Telehealth: Payer: Self-pay

## 2018-12-07 NOTE — Telephone Encounter (Signed)
Patient is Approved for Monovisc, left knee. [place] Patient will be responsible for 20% OOP. No Co-pay No PA required  Appt.01/07/2019 with Dr. Ninfa Linden

## 2019-01-07 ENCOUNTER — Other Ambulatory Visit: Payer: Self-pay

## 2019-01-07 ENCOUNTER — Encounter: Payer: Self-pay | Admitting: Orthopaedic Surgery

## 2019-01-07 ENCOUNTER — Ambulatory Visit (INDEPENDENT_AMBULATORY_CARE_PROVIDER_SITE_OTHER): Payer: Medicare Other | Admitting: Orthopaedic Surgery

## 2019-01-07 DIAGNOSIS — M1712 Unilateral primary osteoarthritis, left knee: Secondary | ICD-10-CM

## 2019-01-07 MED ORDER — HYALURONAN 88 MG/4ML IX SOSY
88.0000 mg | PREFILLED_SYRINGE | INTRA_ARTICULAR | Status: AC | PRN
  Administered 2019-01-07: 88 mg via INTRA_ARTICULAR

## 2019-01-07 NOTE — Progress Notes (Signed)
   Procedure Note  Patient: Gerald Dorsey             Date of Birth: [birth year]           MRN: [id number]             Visit Date: 01/07/2019  Procedures: Visit Diagnoses:  1. Unilateral primary osteoarthritis, left knee     Large Joint Inj: L knee on 01/07/2019 8:44 AM Indications: diagnostic evaluation and pain Details: 22 G 1.5 in needle, superolateral approach  Arthrogram: No  Medications: 88 mg Hyaluronan 88 MG/4ML Outcome: tolerated well, no immediate complications Procedure, treatment alternatives, risks and benefits explained, specific risks discussed. Consent was given by the patient. Immediately prior to procedure a time out was called to verify the correct patient, procedure, equipment, support staff and site/side marked as required. Patient was prepped and draped in the usual sterile fashion.    The patient comes in today for a scheduled hyaluronic acid injection with Monovisc into his left knee to treat the pain from known osteoarthritis.  He is tried failed other forms conservative treatment and this included a steroid injection.  His pain is mainly on the medial joint line.  It is getting worse for him.  It mainly hurts with mobility and weightbearing.  He is an avid walker.  He has had a history of a right total hip arthroplasty that we did 8 months ago.  Examination of his left knee shows no effusion.  He does have slight varus malalignment.  He has full range of motion of his left knee with medial joint line tenderness.  I did provide a Monovisc injection in his left knee without difficulty.  He tolerated well.  We will see him back in 4 months which would be the one-year visit follow-up for his hip replacement.  At that visit we will have a standing low AP pelvis and lateral of his right operative hip.  If he is still having the issues I would like a standing AP and lateral of his left knee.

## 2019-02-22 IMAGING — RF DG C-ARM 61-120 MIN
1 series · 4 of 4 positions shown · non-contrast
Comparison: Pelvis and right hip films of 02/07/2018

CLINICAL DATA: Right total hip replacement

EXAM:
DG C-ARM 61-120 MIN

[Series 1: run · 4 of 4 slices shown]
[im 1/4]
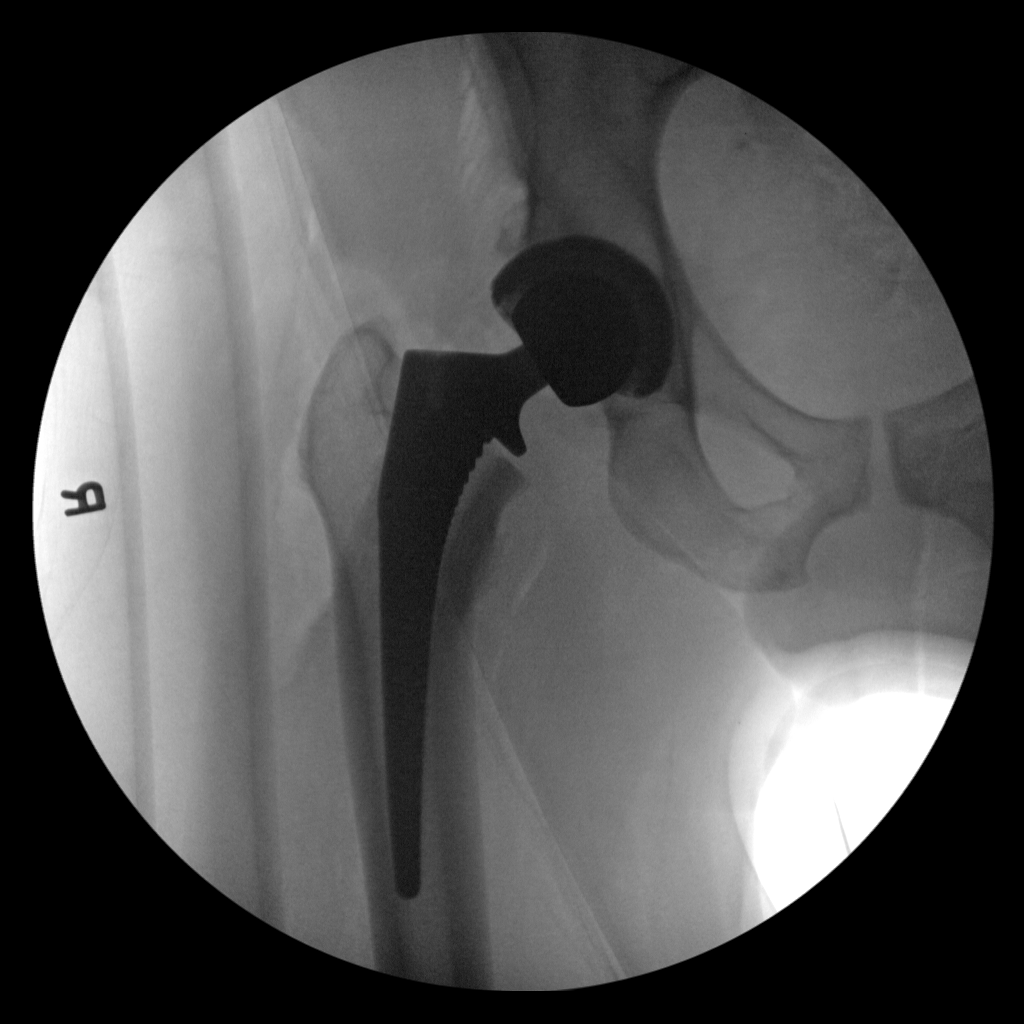
[im 2/4]
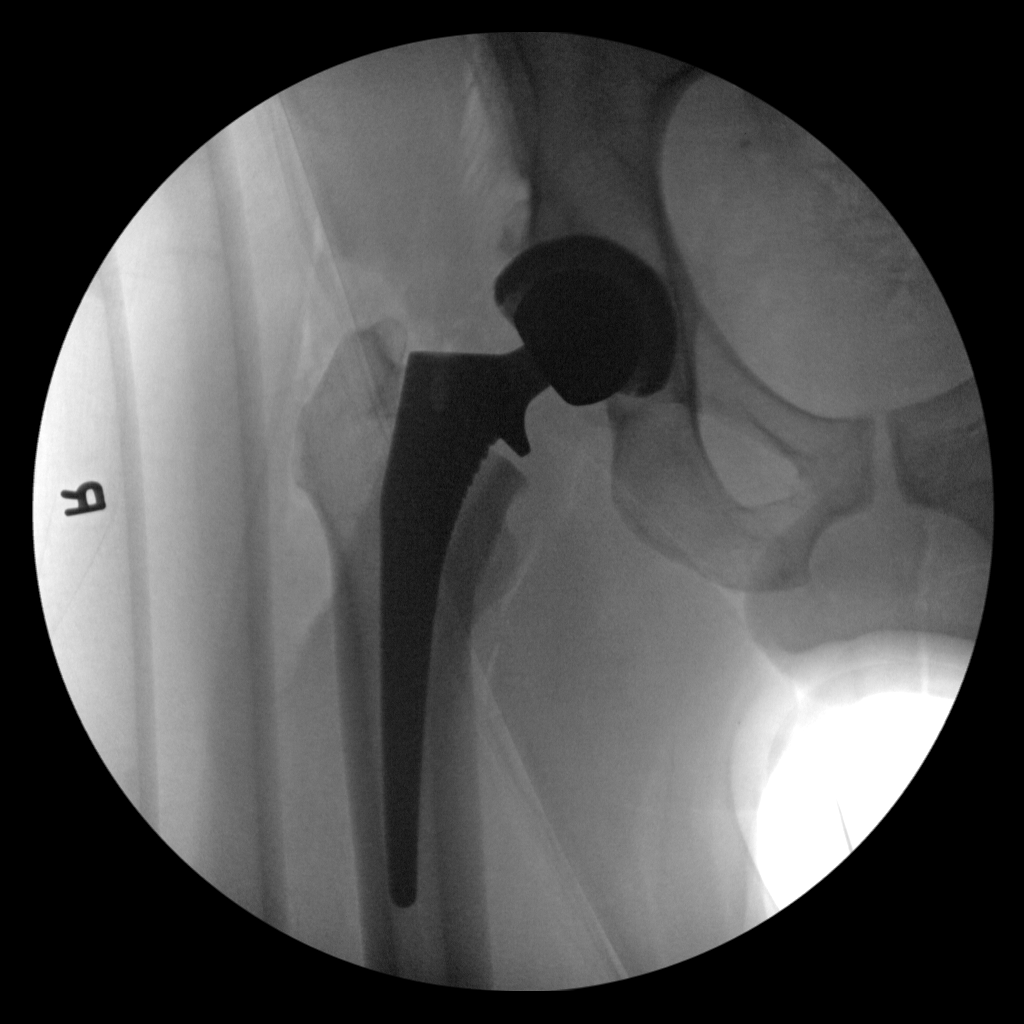
[im 3/4]
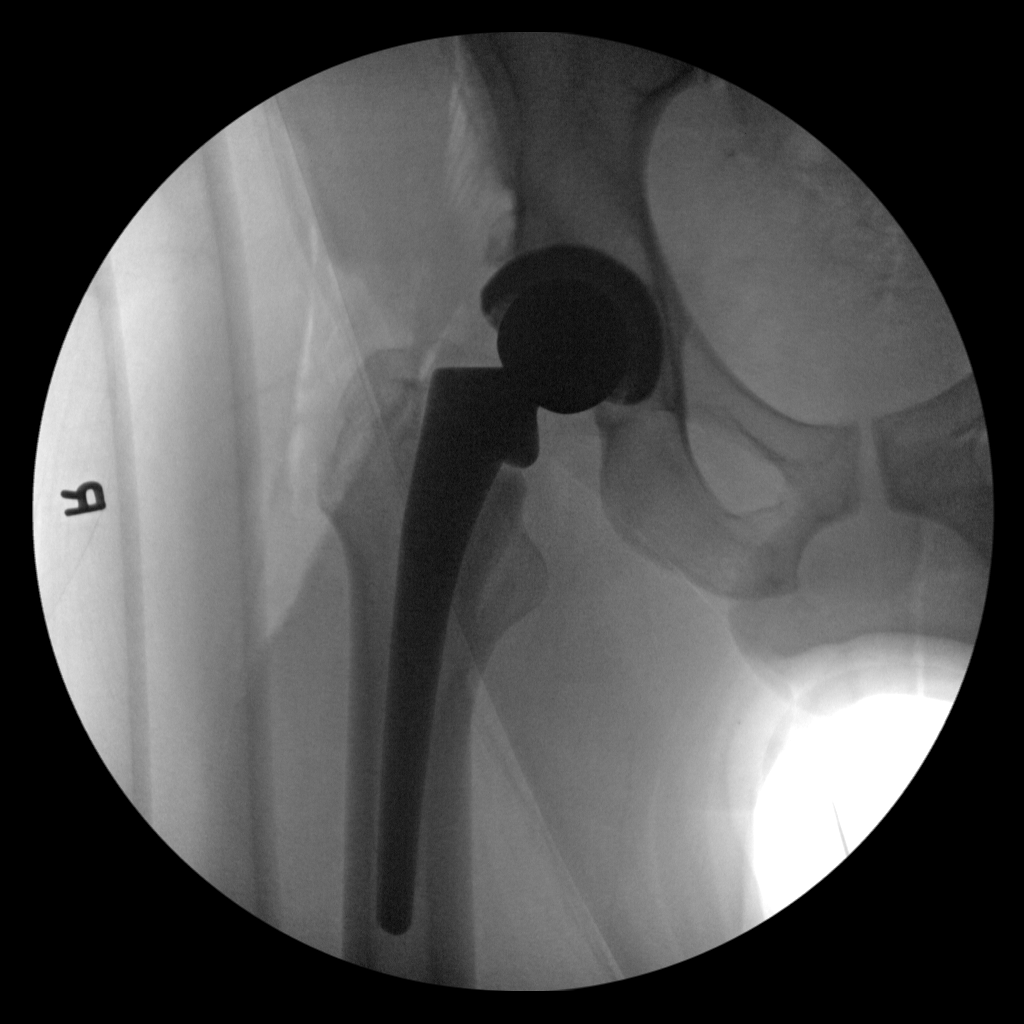
[im 4/4]
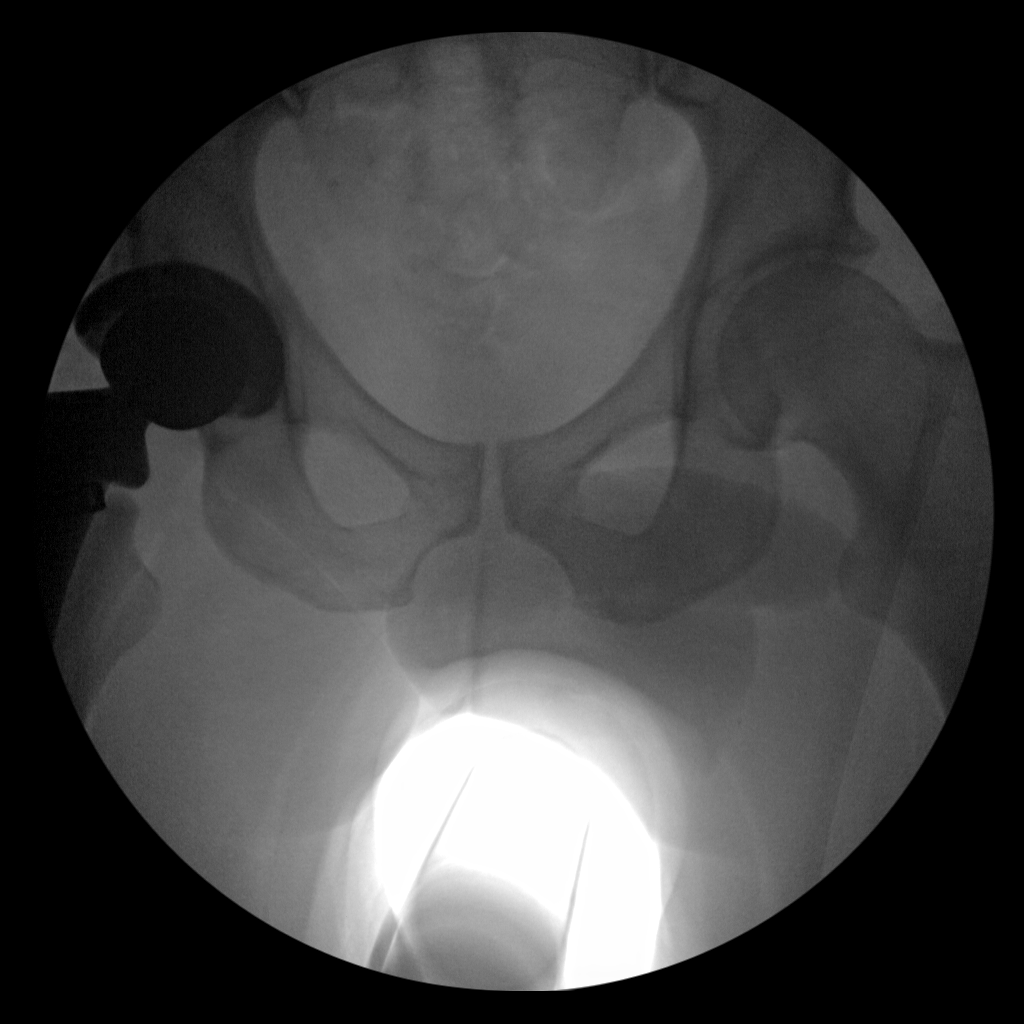

[4 of 4 positions shown; findings below may reference images not displayed]

FINDINGS: C-arm fluoroscopy was provided during right hip replacement.
Fluoroscopy time of 40 seconds was recorded.
IMPRESSION: C-arm fluoroscopy provided.

## 2019-05-08 ENCOUNTER — Ambulatory Visit: Payer: Medicare Other | Admitting: Orthopaedic Surgery

## 2019-05-14 ENCOUNTER — Ambulatory Visit (INDEPENDENT_AMBULATORY_CARE_PROVIDER_SITE_OTHER): Payer: Medicare Other | Admitting: Orthopaedic Surgery

## 2019-05-14 ENCOUNTER — Ambulatory Visit (INDEPENDENT_AMBULATORY_CARE_PROVIDER_SITE_OTHER): Payer: Medicare Other

## 2019-05-14 ENCOUNTER — Telehealth: Payer: Self-pay

## 2019-05-14 ENCOUNTER — Encounter: Payer: Self-pay | Admitting: Orthopaedic Surgery

## 2019-05-14 ENCOUNTER — Other Ambulatory Visit: Payer: Self-pay

## 2019-05-14 DIAGNOSIS — Z96641 Presence of right artificial hip joint: Secondary | ICD-10-CM

## 2019-05-14 DIAGNOSIS — M1712 Unilateral primary osteoarthritis, left knee: Secondary | ICD-10-CM | POA: Diagnosis not present

## 2019-05-14 MED ORDER — LIDOCAINE HCL 1 % IJ SOLN
3.0000 mL | INTRAMUSCULAR | Status: AC | PRN
  Administered 2019-05-14: 09:00:00 3 mL

## 2019-05-14 MED ORDER — METHYLPREDNISOLONE ACETATE 40 MG/ML IJ SUSP
40.0000 mg | INTRAMUSCULAR | Status: AC | PRN
  Administered 2019-05-14: 09:00:00 40 mg via INTRA_ARTICULAR

## 2019-05-14 NOTE — Telephone Encounter (Signed)
Left knee gel injection ?

## 2019-05-14 NOTE — Progress Notes (Signed)
Office Visit Note   Patient: Gerald Dorsey           Date of Birth: June 26, 1950           MRN: [id number] Visit Date: 05/14/2019              Requested by: Filomena Jungling, NP [address] [place],  [place] [address] PCP: Filomena Jungling, NP   Assessment & Plan: Visit Diagnoses:  1. Unilateral primary osteoarthritis, left knee   2. History of right hip replacement   3. Status post total replacement of right hip     Plan: Since it has been 4 months since his last knee steroid injection on the left side, I did recommend a steroid injection today and hopefully 4 weeks from now a hyaluronic acid injection in the left knee to treat the pain from moderate osteoarthritis.  He did tolerate the steroid injection well today.  He is aware the risk and benefits of these types of injections having had them before.  All question concerns were answered and addressed.  He will continue his golf activities as comfort allows.  I will see him back in 4 weeks for hopefully placing hyaluronic acid into the left knee.  Follow-Up Instructions: Return in about 4 weeks (around 06/11/2019).   Orders:  Orders Placed This Encounter  Procedures  . Large Joint Inj  . XR HIP UNILAT W OR W/O PELVIS 2-3 VIEWS RIGHT  . XR Knee 1-2 Views Left   No orders of the defined types were placed in this encounter.     Procedures: Large Joint Inj: L knee on 05/14/2019 8:57 AM Indications: diagnostic evaluation and pain Details: 22 G 1.5 in needle, superolateral approach  Arthrogram: No  Medications: 3 mL lidocaine 1 %; 40 mg methylPREDNISolone acetate 40 MG/ML Outcome: tolerated well, no immediate complications Procedure, treatment alternatives, risks and benefits explained, specific risks discussed. Consent was given by the patient. Immediately prior to procedure a time out was called to verify the correct patient, procedure, equipment, support staff and site/side marked as required. Patient was prepped and draped in  the usual sterile fashion.       Clinical Data: No additional findings.   Subjective: Chief Complaint  Patient presents with  . Right Hip - Follow-up  . Left Knee - Pain  Patient is a very pleasant and active 68 year old gentleman who is an avid golfer.  He is 1 year out from a right total hip arthroplasty.  He says the right hip is doing very well.  We have been seeing him also for his left knee.  He has had at least 2 steroid injections in that left knee due to symptoms osteoarthritis.  He never gets swelling in the left knee but it does hurt on occasion with pivoting activities especially during golf.  It is on the medial side of his knee that he points to as the area that hurts the most.  He denies any locking catching.  HPI  Review of Systems He currently denies any headache, chest pain, shortness of breath, fever, chills, nausea, vomiting.  There is been no other acute changes in his medical status  Objective: Vital Signs: There were no vitals taken for this visit.  Physical Exam He is alert and orient x3 and in no acute distress Ortho Exam Examination of his right hip shows that it moves smoothly and fluidly and just like his left hip is pain-free.  His leg lengths are equal.  His left  knee shows no effusion.  His range of motion is full but there is medial joint line tenderness.  His Lachman's exam is negative and his McMurray's exam is negative on the left knee. Specialty Comments:  No specialty comments available.  Imaging: XR HIP UNILAT W OR W/O PELVIS 2-3 VIEWS RIGHT  Result Date: 05/14/2019 An AP pelvis and lateral right hip shows a well-seated total hip arthroplasty with no complicating features.  The left hip on the AP view shows no significant arthritic changes.  XR Knee 1-2 Views Left  Result Date: 05/14/2019 2 views of the left knee show moderate arthritic changes with slight varus malalignment and medial joint space narrowing.  There is patellofemoral  arthritic changes and osteophytes in the patellofemoral joint and a marginal osteophyte medially.    PMFS History: Patient Active Problem List   Diagnosis Date Noted  . Unilateral primary osteoarthritis, left knee 12/03/2018  . Status post total replacement of right hip 04/24/2018  . Primary osteoarthritis of both knees 08/01/2017  . Primary osteoarthritis of right hip 06/08/2017   Past Medical History:  Diagnosis Date  . Colon polyps    Hx of colon polyps per pt report - no date  . History of kidney stones   . Hyperlipidemia   . Osteoarthritis    right hip    Family History  Problem Relation Age of Onset  . Hyperlipidemia Mother   . Hypertension Mother     Past Surgical History:  Procedure Laterality Date  . COLONOSCOPY    . TOTAL HIP ARTHROPLASTY Right 04/24/2018   Procedure: RIGHT TOTAL HIP ARTHROPLASTY ANTERIOR APPROACH;  Surgeon: Mcarthur Rossetti, MD;  Location: [facility];  Service: Orthopedics;  Laterality: Right;   Social History   Occupational History  . Not on file  Tobacco Use  . Smoking status: Never Smoker  . Smokeless tobacco: Never Used  Substance and Sexual Activity  . Alcohol use: Not Currently  . Drug use: Never  . Sexual activity: Not on file

## 2019-05-14 NOTE — Telephone Encounter (Signed)
Noted.  Will submit in January, 2021.  Patient is not able to receive another gel injection until after 07/10/2019.  It will be 6 months after that date.

## 2019-06-17 ENCOUNTER — Other Ambulatory Visit: Payer: Self-pay

## 2019-06-17 ENCOUNTER — Telehealth: Payer: Self-pay

## 2019-06-17 ENCOUNTER — Encounter: Payer: Self-pay | Admitting: Orthopaedic Surgery

## 2019-06-17 ENCOUNTER — Ambulatory Visit (INDEPENDENT_AMBULATORY_CARE_PROVIDER_SITE_OTHER): Payer: Medicare Other | Admitting: Orthopaedic Surgery

## 2019-06-17 DIAGNOSIS — M1712 Unilateral primary osteoarthritis, left knee: Secondary | ICD-10-CM

## 2019-06-17 NOTE — Progress Notes (Signed)
The patient is here today to talk about his left knee.  I did place a steroid injection in that knee 4 weeks ago.  He has had hyaluronic acid before.  I was hoping to place hyaluronic acid back in his knee today.  Unfortunately though, I do not count the months correctly.  It needs to be at least 6 months in between hyaluronic acid injections for insurance to cover this.  Its only been 5 months.  6 months will be on February 17.  I apologized to him and told him we would submit for a gel injection again in his left knee and we would do that in a month from now.  All question concerns were answered and addressed.  This is no charge appointment today as well.

## 2019-06-17 NOTE — Telephone Encounter (Signed)
Submitted VOB for Monovisc, left knee. 

## 2019-06-18 ENCOUNTER — Encounter: Payer: Self-pay | Admitting: Orthopaedic Surgery

## 2019-07-01 ENCOUNTER — Telehealth: Payer: Self-pay

## 2019-07-01 NOTE — Telephone Encounter (Signed)
Approved for Monovisc, left knee. Buy & Bill Must meet Medicare deductible first Secondary insurance will pick up remaining eligible expenses at 100%. No Co-pay No PA required  Appt. 07/11/2019 with Dr. Magnus Ivan

## 2019-07-10 ENCOUNTER — Ambulatory Visit: Payer: Medicare Other | Admitting: Orthopaedic Surgery

## 2019-07-11 ENCOUNTER — Ambulatory Visit: Payer: Medicare Other | Admitting: Orthopaedic Surgery

## 2019-07-16 ENCOUNTER — Other Ambulatory Visit: Payer: Self-pay

## 2019-07-16 ENCOUNTER — Encounter: Payer: Self-pay | Admitting: Orthopaedic Surgery

## 2019-07-16 ENCOUNTER — Ambulatory Visit (INDEPENDENT_AMBULATORY_CARE_PROVIDER_SITE_OTHER): Payer: Medicare Other | Admitting: Orthopaedic Surgery

## 2019-07-16 DIAGNOSIS — M1712 Unilateral primary osteoarthritis, left knee: Secondary | ICD-10-CM

## 2019-07-16 MED ORDER — HYALURONAN 88 MG/4ML IX SOSY
88.0000 mg | PREFILLED_SYRINGE | INTRA_ARTICULAR | Status: AC | PRN
  Administered 2019-07-16: 09:00:00 88 mg via INTRA_ARTICULAR

## 2019-07-16 NOTE — Progress Notes (Signed)
   Procedure Note  Patient: Gerald Dorsey             Date of Birth: 01-23-1951           MRN: [id number]             Visit Date: 07/16/2019 HPI: Mr. Capp comes in today for Monovisc injection left knee.  Again he has known arthritis of the left knee.  He has had no new injury.  Having pain mainly medial aspect of the knee.  Physical exam: Left knee full extension full flexion.  No effusion abnormal warmth or erythema.  Procedures: Visit Diagnoses:  1. Unilateral primary osteoarthritis, left knee     Large Joint Inj: L knee on 07/16/2019 9:07 AM Indications: pain Details: 22 G 1.5 in needle, superolateral approach  Arthrogram: No  Medications: 88 mg Hyaluronan 88 MG/4ML Outcome: tolerated well, no immediate complications Procedure, treatment alternatives, risks and benefits explained, specific risks discussed. Consent was given by the patient. Immediately prior to procedure a time out was called to verify the correct patient, procedure, equipment, support staff and site/side marked as required. Patient was prepped and draped in the usual sterile fashion.     Plan: He will continue to work on quad strengthening and knee.  Follow-up with [place] in 2 months to see what type of response he had to the injection.  Questions were encouraged and answered.

## 2019-09-16 ENCOUNTER — Other Ambulatory Visit: Payer: Self-pay

## 2019-09-16 ENCOUNTER — Ambulatory Visit (INDEPENDENT_AMBULATORY_CARE_PROVIDER_SITE_OTHER): Payer: Medicare Other | Admitting: Orthopaedic Surgery

## 2019-09-16 ENCOUNTER — Encounter: Payer: Self-pay | Admitting: Orthopaedic Surgery

## 2019-09-16 DIAGNOSIS — M1712 Unilateral primary osteoarthritis, left knee: Secondary | ICD-10-CM

## 2019-09-16 NOTE — Progress Notes (Signed)
The patient comes in today about 2 months after having hyaluronic acid with Monovisc placed in his left knee to treat the pain from osteoarthritis.  He does have moderate medial compartment arthritis in the left knee.  This is verified by plain films.  He has had steroid injections as well.  He is 69 years old and an avid golfer.  He states that his knee is feeling pretty good.  He does occasionally have some pain in the medial joint line but not enough that it is warranting any type of intervention.  He is actually lost 7 pounds over the last several weeks and months.  He has a history of a right total hip arthroplasty that replaced a year or more ago.  On examination of his left knee there is no effusion.  He has a negative McMurray's exam with some mild medial joint line tenderness.  His range of motion is full.  He has slight varus malalignment that is easily correctable.  At this point follow-up can be as needed.  He understands that we will pursue a conservative treatment route such as continued steroid and/or hyaluronic acid injections as needed.  He understands that the hyaluronic acid injections were placed about 6 months apart as needed.  All question concerns were answered and addressed.

## 2020-03-07 ENCOUNTER — Ambulatory Visit: Payer: Medicare Other

## 2020-11-21 ENCOUNTER — Emergency Department (HOSPITAL_BASED_OUTPATIENT_CLINIC_OR_DEPARTMENT_OTHER)
Admission: EM | Admit: 2020-11-21 | Discharge: 2020-11-21 | Disposition: A | Discharge status: Home / Self Care | Payer: Medicare Other | Attending: Emergency Medicine | Admitting: Emergency Medicine

## 2020-11-21 ENCOUNTER — Encounter (HOSPITAL_BASED_OUTPATIENT_CLINIC_OR_DEPARTMENT_OTHER): Payer: Self-pay | Admitting: *Deleted

## 2020-11-21 ENCOUNTER — Other Ambulatory Visit: Payer: Self-pay

## 2020-11-21 DIAGNOSIS — Z96641 Presence of right artificial hip joint: Secondary | ICD-10-CM | POA: Insufficient documentation

## 2020-11-21 DIAGNOSIS — Z20822 Contact with and (suspected) exposure to covid-19: Secondary | ICD-10-CM | POA: Insufficient documentation

## 2020-11-21 DIAGNOSIS — J029 Acute pharyngitis, unspecified: Secondary | ICD-10-CM

## 2020-11-21 DIAGNOSIS — R0982 Postnasal drip: Secondary | ICD-10-CM

## 2020-11-21 LAB — GROUP A STREP BY PCR: Group A Strep by PCR: NOT DETECTED

## 2020-11-21 MED ORDER — DEXAMETHASONE SODIUM PHOSPHATE 4 MG/ML IJ SOLN
4.0000 mg | Freq: Once | INTRAMUSCULAR | Status: AC
  Administered 2020-11-21: 4 mg via INTRAMUSCULAR
  Filled 2020-11-21: qty 1

## 2020-11-21 NOTE — ED Provider Notes (Signed)
[facility] EMERGENCY DEPARTMENT Provider Note  CSN: [id number] Arrival date & time: 11/21/20 1152    History Chief Complaint  Patient presents with   Sore Throat    Gerald Dorsey is a 70 y.o. male reports 2 days of sore throat, post nasal drip, ear congestion and problems with his equilibrium. He reports this happens to him a couple of times a year. Typically he goes to [facility] and is given a steroid shot and a Zpak with resolution after a few days. He has not had any fever. No cough, N/V/D.    Past Medical History:  Diagnosis Date   Colon polyps    Hx of colon polyps per pt report - no date   History of kidney stones    Hyperlipidemia    Osteoarthritis    right hip    Past Surgical History:  Procedure Laterality Date   COLONOSCOPY     TOTAL HIP ARTHROPLASTY Right 04/24/2018   Procedure: RIGHT TOTAL HIP ARTHROPLASTY ANTERIOR APPROACH;  Surgeon: Kathryne Hitch, MD;  Location: [facility];  Service: Orthopedics;  Laterality: Right;    Family History  Problem Relation Age of Onset   Hyperlipidemia Mother    Hypertension Mother     Social History   Tobacco Use   Smoking status: Never   Smokeless tobacco: Never  Vaping Use   Vaping Use: Never used  Substance Use Topics   Alcohol use: Yes    Comment: occasionally   Drug use: Never     Home Medications Prior to Admission medications   Not on File     Allergies    Patient has no known allergies.   Review of Systems   Review of Systems A comprehensive review of systems was completed and negative except as noted in HPI.    Physical Exam BP (!) 169/84 (BP Location: Right Arm)   Pulse 69   Temp 98.2 F (36.8 C) (Oral)   Resp 16   Ht 6' (1.829 m)   Wt 104.8 kg   SpO2 100%   BMI 31.33 kg/m   Physical Exam Vitals and nursing note reviewed.  Constitutional:      Appearance: Normal appearance.  HENT:     Head: Normocephalic and atraumatic.     Right Ear: Tympanic membrane normal.      Left Ear: Tympanic membrane normal.     Nose: Nose normal.     Mouth/Throat:     Mouth: Mucous membranes are moist.     Tonsils: No tonsillar exudate or tonsillar abscesses.  Eyes:     Extraocular Movements: Extraocular movements intact.     Conjunctiva/sclera: Conjunctivae normal.  Cardiovascular:     Rate and Rhythm: Normal rate.  Pulmonary:     Effort: Pulmonary effort is normal.     Breath sounds: Normal breath sounds.  Abdominal:     General: Abdomen is flat.     Palpations: Abdomen is soft.     Tenderness: There is no abdominal tenderness.  Musculoskeletal:        General: No swelling. Normal range of motion.     Cervical back: Neck supple.  Lymphadenopathy:     Cervical: No cervical adenopathy.  Skin:    General: Skin is warm and dry.  Neurological:     General: No focal deficit present.     Mental Status: He is alert.  Psychiatric:        Mood and Affect: Mood normal.     ED Results /  Procedures / Treatments   Labs (all labs ordered are listed, but only abnormal results are displayed) Labs Reviewed  GROUP A STREP BY PCR  SARS CORONAVIRUS 2 (TAT 6-24 HRS)    EKG None   Radiology No results found.  Procedures Procedures  Medications Ordered in the ED Medications  dexamethasone (DECADRON) injection 4 mg (4 mg Intramuscular Given 11/21/20 1304)     MDM Rules/Calculators/A&P MDM  Patient with URI/allergy symptoms. Does not have any concerning exam findings. Will check strep and Covid. Advised that absent a bacterial source, antibiotics would not be indicated which he understands. He has had prior benefit from steroids, so that may be helpful as well  ED Course  I have reviewed the triage vital signs and the nursing notes.  Pertinent labs & imaging results that were available during my care of the patient were reviewed by me and considered in my medical decision making (see chart for details).  Clinical Course as of 11/21/20 1405  Sat Nov 21, 2020   1302 Strep is negative.  [CS]    Clinical Course User Index [CS] Pollyann Savoy, MD    Final Clinical Impression(s) / ED Diagnoses Final diagnoses:  Pharyngitis, unspecified etiology  Post-nasal drip    Rx / DC Orders ED Discharge Orders     None        Pollyann Savoy, MD 11/21/20 [phone]

## 2020-11-21 NOTE — ED Triage Notes (Signed)
Patient stated that he had a sore throat and his equilibrium is slightly off x 2 days.

## 2020-11-22 LAB — SARS CORONAVIRUS 2 (TAT 6-24 HRS): SARS Coronavirus 2: NEGATIVE

## 2021-03-05 ENCOUNTER — Telehealth: Payer: Self-pay

## 2021-03-05 NOTE — Telephone Encounter (Signed)
NOTES SCANNED TO REFERRAL 

## 2021-04-18 NOTE — Progress Notes (Signed)
Cardiology Office Note:   Date:  04/19/2021  NAME:  Gerald Dorsey    MRN: [id number] DOB:  1950-07-02   PCP:  Filomena Jungling, NP  Cardiologist:  None  Electrophysiologist:  None   Referring MD: Carmel Sacramento, NP   Chief Complaint  Patient presents with   New Patient (Initial Visit)   History of Present Illness:   Gerald Dorsey is a 70 y.o. male with a hx of HLD who is being seen today for the evaluation of HLD at the request of Carmel Sacramento, NP.  He has a history of hyperlipidemia.  He currently takes no medications.  He informs me he takes no other medications.  There is no history of high blood pressure.  He has never had a heart attack or stroke.  He reports he is interested in calcium scoring.  His primary care physician discussed this with him.  We unfortunately do not have the results of his most recent lipid profile.  We will obtain this.  Regardless he has never had a heart attack or stroke.  There is no strong family history of heart disease.  He reports he is quite active and exercises often.  He exercises 4-1/2 days/week.  He can walk up to 3-1/2 miles per day without chest pain or trouble breathing.  He plays golf frequently as well.  He is a member at [employer].  His EKG in office demonstrates normal sinus rhythm with no acute ischemic changes or evidence of infarction.  He reports his diet is fairly decent.  He avoids fatty foods and fried foods.  He has 2 sons.  He has several grandchildren.  He is originally from [place].  He worked in Catering manager in Education officer, environmental.  His last job was in [employer] and he retired in [employer].  Overall denies any cardiac symptoms.  He seems to be fairly healthy.  Past Medical History: Past Medical History:  Diagnosis Date   Colon polyps    Hx of colon polyps per pt report - no date   History of kidney stones    Hyperlipidemia    Osteoarthritis    right hip    Past Surgical History: Past Surgical History:  Procedure  Laterality Date   COLONOSCOPY     TOTAL HIP ARTHROPLASTY Right 04/24/2018   Procedure: RIGHT TOTAL HIP ARTHROPLASTY ANTERIOR APPROACH;  Surgeon: Kathryne Hitch, MD;  Location: [facility];  Service: Orthopedics;  Laterality: Right;   TOTAL HIP ARTHROPLASTY      Current Medications: No outpatient medications have been marked as taking for the 04/19/21 encounter (Office Visit) with Sande Rives, MD.     Allergies:    Patient has no known allergies.   Social History: Social History   Socioeconomic History   Marital status: Married    Spouse name: Not on file   Number of children: 2   Years of education: Not on file   Highest education level: Not on file  Occupational History   Occupation: retired Catering manager work  Tobacco Use   Smoking status: Never   Smokeless tobacco: Never  Vaping Use   Vaping Use: Never used  Substance and Sexual Activity   Alcohol use: Yes    Comment: occasionally   Drug use: Never   Sexual activity: Not on file  Other Topics Concern   Not on file  Social History Narrative   Not on file   Social Determinants of Health   Financial Resource Strain: Not on  file  Food Insecurity: Not on file  Transportation Needs: Not on file  Physical Activity: Not on file  Stress: Not on file  Social Connections: Not on file     Family History: The patient's family history includes Aneurysm in his father; Hyperlipidemia in his mother; Hypertension in his mother.  ROS:   All other ROS reviewed and negative. Pertinent positives noted in the HPI.     EKGs/Labs/Other Studies Reviewed:   The following studies were personally reviewed by me today:  EKG:  EKG is  ordered today.  The ekg ordered today demonstrates normal sinus rhythm heart rate 63, no acute ischemic changes or evidence of infarction, and was personally reviewed by me.   Recent Labs: No results found for requested labs within last 8760 hours.   Recent Lipid Panel No results found  for: CHOL, TRIG, HDL, CHOLHDL, VLDL, LDLCALC, LDLDIRECT  Physical Exam:   VS:  BP 138/76 (BP Location: Left Arm, Patient Position: Sitting, Cuff Size: Large)   Pulse 63   Ht 6' (1.829 m)   Wt 235 lb (106.6 kg)   BMI 31.87 kg/m    Wt Readings from Last 3 Encounters:  04/19/21 235 lb (106.6 kg)  11/21/20 231 lb (104.8 kg)  04/24/18 239 lb (108.4 kg)    General: Well nourished, well developed, in no acute distress Head: Atraumatic, normal size  Eyes: PEERLA, EOMI  Neck: Supple, no JVD Endocrine: No thryomegaly Cardiac: Normal S1, S2; RRR; no murmurs, rubs, or gallops Lungs: Clear to auscultation bilaterally, no wheezing, rhonchi or rales  Abd: Soft, nontender, no hepatomegaly  Ext: No edema, pulses 2+ Musculoskeletal: No deformities, BUE and BLE strength normal and equal Skin: Warm and dry, no rashes   Neuro: Alert and oriented to person, place, time, and situation, CNII-XII grossly intact, no focal deficits  Psych: Normal mood and affect   ASSESSMENT:   Gerald Dorsey is a 70 y.o. male who presents for the following: 1. Mixed hyperlipidemia    PLAN:    1. Mixed hyperlipidemia -History of hyperlipidemia.  Unfortunately results were not sent with his packet of information from his primary care physician.  Regardless he was sent to consider calcium scoring.  We discussed that this is a decision treated for using cholesterol-lowering agents.  He reports he is not interested in statin agents but may be interested in other agents.  We will obtain the results from his primary care physician and go ahead with calcium scoring.  I think this is reasonable to do.  I will then get in touch with him about the results of his calcium score after I review his cholesterol profile as well.  We will then make a plan regarding need for cholesterol-lowering agents. -Regardless he is quite healthy and active.  His EKG is normal.  He can exercise 4.5 days/week and walk up to 3-1/2 miles without limitation  such as chest pain or trouble breathing.  Regardless of what we find on calcium scoring I do not believe he will have concerns for obstructive CAD.  Disposition: Return if symptoms worsen or fail to improve.  Medication Adjustments/Labs and Tests Ordered: Current medicines are reviewed at length with the patient today.  Concerns regarding medicines are outlined above.  Orders Placed This Encounter  Procedures   CT CARDIAC SCORING (SELF PAY ONLY)   EKG 12-Lead    No orders of the defined types were placed in this encounter.   Patient Instructions  Medication Instructions:  The current medical  regimen is effective;  continue present plan and medications.  *If you need a refill on your cardiac medications before your next appointment, please call your pharmacy*   Testing/Procedures: CALCIUM SCORE   Follow-Up: At [facility], you and your health needs are our priority.  As part of our continuing mission to provide you with exceptional heart care, we have created designated Provider Care Teams.  These Care Teams include your primary Cardiologist (physician) and Advanced Practice Providers (APPs -  Physician Assistants and Nurse Practitioners) who all work together to provide you with the care you need, when you need it.  We recommend signing up for the patient portal called "MyChart".  Sign up information is provided on this After Visit Summary.  MyChart is used to connect with patients for Virtual Visits (Telemedicine).  Patients are able to view lab/test results, encounter notes, upcoming appointments, etc.  Non-urgent messages can be sent to your provider as well.   To learn more about what you can do with MyChart, go to [email or web address].    Your next appointment:   As needed  The format for your next appointment:   In Person  Provider:   Lennie Odor, MD     Signed, Lenna Gilford. Flora Lipps, MD, [facility]  [facility]  [address] [place], [place] [address] [phone]  04/19/2021 9:05 AM

## 2021-04-19 ENCOUNTER — Encounter: Payer: Self-pay | Admitting: Cardiovascular Disease

## 2021-04-19 ENCOUNTER — Ambulatory Visit (INDEPENDENT_AMBULATORY_CARE_PROVIDER_SITE_OTHER): Payer: Medicare Other | Admitting: Cardiovascular Disease

## 2021-04-19 ENCOUNTER — Other Ambulatory Visit: Payer: Self-pay

## 2021-04-19 VITALS — BP 138/76 | HR 63 | Ht 72.0 in | Wt 235.0 lb

## 2021-04-19 DIAGNOSIS — E782 Mixed hyperlipidemia: Secondary | ICD-10-CM

## 2021-04-19 NOTE — Patient Instructions (Signed)
Medication Instructions:  The current medical regimen is effective;  continue present plan and medications.  *If you need a refill on your cardiac medications before your next appointment, please call your pharmacy*   Testing/Procedures: CALCIUM SCORE   Follow-Up: At [facility], you and your health needs are our priority.  As part of our continuing mission to provide you with exceptional heart care, we have created designated Provider Care Teams.  These Care Teams include your primary Cardiologist (physician) and Advanced Practice Providers (APPs -  Physician Assistants and Nurse Practitioners) who all work together to provide you with the care you need, when you need it.  We recommend signing up for the patient portal called "MyChart".  Sign up information is provided on this After Visit Summary.  MyChart is used to connect with patients for Virtual Visits (Telemedicine).  Patients are able to view lab/test results, encounter notes, upcoming appointments, etc.  Non-urgent messages can be sent to your provider as well.   To learn more about what you can do with MyChart, go to [email or web address].    Your next appointment:   As needed  The format for your next appointment:   In Person  Provider:   [place] O'Neal, MD     

## 2021-06-02 ENCOUNTER — Ambulatory Visit (INDEPENDENT_AMBULATORY_CARE_PROVIDER_SITE_OTHER)
Admission: RE | Admit: 2021-06-02 | Discharge: 2021-06-02 | Disposition: A | Discharge status: Home / Self Care | Payer: Self-pay | Source: Ambulatory Visit | Attending: Cardiovascular Disease | Admitting: Cardiovascular Disease

## 2021-06-02 ENCOUNTER — Other Ambulatory Visit: Payer: Self-pay

## 2021-06-02 DIAGNOSIS — E782 Mixed hyperlipidemia: Secondary | ICD-10-CM

## 2021-06-07 ENCOUNTER — Telehealth: Payer: Self-pay | Admitting: Cardiovascular Disease

## 2021-06-07 NOTE — Telephone Encounter (Signed)
Attempted to call Gerald Dorsey about calcium score results. Left message for him to call us back.   [name], MD, [facility]  [address], [place], [place] [address] [phone]  4:38 PM

## 2021-06-08 ENCOUNTER — Telehealth: Payer: Self-pay | Admitting: Cardiovascular Disease

## 2021-06-08 NOTE — Telephone Encounter (Signed)
Called Mr. Meader: CAC score 180 which is 72nd percentile. Recommended statin but he would like alternative. We need to get cholesterol results from PCP and will likely start zetia. Will have him see me in 3-4 months as well.   [name] T. Flora Lipps, MD, [facility]   [facility]  [address], [address] [place], [place] [address] [phone]  9:46 AM

## 2021-06-08 NOTE — Telephone Encounter (Signed)
I sent release, but have not received any labs yet. I called [name]- they will be faxing over information.   Gerald Dorsey- can we have him be seen and scheduled in 3-4 months?   Thank you!

## 2021-06-10 ENCOUNTER — Telehealth: Payer: Self-pay | Admitting: Cardiovascular Disease

## 2021-06-10 NOTE — Telephone Encounter (Signed)
Attempted to call Gerald Dorsey.  Received blood work from [facility].  Total cholesterol 246, HDL 65, LDL 168, triglycerides 59.  Given elevated coronary calcium score would recommend to start Zetia 10 mg daily.  He is not interested in a statin.  I left a message as he did not answer.  He will call us back and we will discuss this with him further.  [name] T. Flora Lipps, MD, [facility]   [facility]  [address] [place], [place] [address] [phone]  2:37 PM

## 2021-06-16 ENCOUNTER — Telehealth: Payer: Self-pay | Admitting: Cardiovascular Disease

## 2021-06-16 MED ORDER — ROSUVASTATIN CALCIUM 20 MG PO TABS: 20.0000 mg | ORAL_TABLET | Freq: Every day | ORAL | 3 refills | Status: DC

## 2021-06-16 NOTE — Telephone Encounter (Signed)
Called Gerald Dorsey. Discussed cholesterol. Prescribed crestor 20 mg daily. He will let us know if he starts this and we will base follow-up on that.   [name] T. Flora Lipps, MD, [facility]   [facility]  [address] [place], [place] [address] [phone]  11:36 AM

## 2021-06-16 NOTE — Telephone Encounter (Signed)
MD called originally and would like to speak with him.  Will route to MD.

## 2021-06-16 NOTE — Telephone Encounter (Signed)
Pt returning phone call to Dr. Scharlene Gloss for results.. please advise

## 2021-06-16 NOTE — Telephone Encounter (Signed)
Yes, crestor 20 mg was sent to the pharmacy.

## 2021-09-28 NOTE — Progress Notes (Signed)
?Cardiology Office Note:   ?Date:  09/29/2021  ?NAME:  Gerald Dorsey    ?MRN: [id number] ?DOB:  1950/10/27  ? ?PCP:  Filomena Jungling, NP  ?Cardiologist:  None  ?Electrophysiologist:  None  ? ?Referring MD: Filomena Jungling, NP  ? ?Chief Complaint  ?Patient presents with  ? Follow-up  ?  3-4 months.  ? ? ?History of Present Illness:   ?Gerald Dorsey is a 71 y.o. male with a hx of HLD and coronary calcium who presents for follow-up.  We discussed starting Crestor at last visit.  He reports he did not do this.  He has been exercising and pursuing natural supplements.  He has been pursuing fish oil as well as red yeast rice.  He reports he is not ready to commit to a statin.  Denies any chest pain or trouble breathing.  Still playing lots of golf.  No limitations.  We discussed the benefits of statin therapy.  He reports he would like to hold on this for now.  We also discussed Zetia.  He may be interested in this.  He would like to recheck his cholesterol to determine if there is been a change.  I think this is reasonable.  Not wanting to start medications today. ? ?Problem List ?CAC ?-CAC 180 (72nd percentile) ?2. HLD ?-T chol 239, HDL 67, LDL 157, triglycerides 87 ? ?Past Medical History: ?Past Medical History:  ?Diagnosis Date  ? Colon polyps   ? Hx of colon polyps per pt report - no date  ? History of kidney stones   ? Hyperlipidemia   ? Osteoarthritis   ? right hip  ? ? ?Past Surgical History: ?Past Surgical History:  ?Procedure Laterality Date  ? COLONOSCOPY    ? TOTAL HIP ARTHROPLASTY Right 04/24/2018  ? Procedure: RIGHT TOTAL HIP ARTHROPLASTY ANTERIOR APPROACH;  Surgeon: Kathryne Hitch, MD;  Location: [facility] OR;  Service: Orthopedics;  Laterality: Right;  ? TOTAL HIP ARTHROPLASTY    ? ? ?Current Medications: ?Current Meds  ?Medication Sig  ? rosuvastatin (CRESTOR) 20 MG tablet Take 1 tablet (20 mg total) by mouth daily.  ?  ? ?Allergies:    ?Patient has no known allergies.  ? ?Social History: ?Social History   ? ?Socioeconomic History  ? Marital status: Married  ?  Spouse name: Not on file  ? Number of children: 2  ? Years of education: Not on file  ? Highest education level: Not on file  ?Occupational History  ? Occupation: retired Catering manager work  ?Tobacco Use  ? Smoking status: Never  ? Smokeless tobacco: Never  ?Vaping Use  ? Vaping Use: Never used  ?Substance and Sexual Activity  ? Alcohol use: Yes  ?  Comment: occasionally  ? Drug use: Never  ? Sexual activity: Not on file  ?Other Topics Concern  ? Not on file  ?Social History Narrative  ? Not on file  ? ?Social Determinants of Health  ? ?Financial Resource Strain: Not on file  ?Food Insecurity: Not on file  ?Transportation Needs: Not on file  ?Physical Activity: Not on file  ?Stress: Not on file  ?Social Connections: Not on file  ?  ? ?Family History: ?The patient's family history includes Aneurysm in his father; Hyperlipidemia in his mother; Hypertension in his mother. ? ?ROS:   ?All other ROS reviewed and negative. Pertinent positives noted in the HPI.    ? ?EKGs/Labs/Other Studies Reviewed:   ?The following studies were personally reviewed by me today: ? ? ?  Recent Labs: ?No results found for requested labs within last 8760 hours.  ? ?Recent Lipid Panel ?No results found for: CHOL, TRIG, HDL, CHOLHDL, VLDL, LDLCALC, LDLDIRECT ? ?Physical Exam:   ?VS:  BP 140/70 (BP Location: Left Arm, Patient Position: Sitting, Cuff Size: Normal)   Pulse 67   Ht 6' (1.829 m)   Wt 243 lb (110.2 kg)   BMI 32.96 kg/m?    ?Wt Readings from Last 3 Encounters:  ?09/29/21 243 lb (110.2 kg)  ?04/19/21 235 lb (106.6 kg)  ?11/21/20 231 lb (104.8 kg)  ?  ?General: Well nourished, well developed, in no acute distress ?Head: Atraumatic, normal size  ?Eyes: PEERLA, EOMI  ?Neck: Supple, no JVD ?Endocrine: No thryomegaly ?Cardiac: Normal S1, S2; RRR; no murmurs, rubs, or gallops ?Lungs: Clear to auscultation bilaterally, no wheezing, rhonchi or rales  ?Abd: Soft, nontender, no hepatomegaly   ?Ext: No edema, pulses 2+ ?Musculoskeletal: No deformities, BUE and BLE strength normal and equal ?Skin: Warm and dry, no rashes   ?Neuro: Alert and oriented to person, place, time, and situation, CNII-XII grossly intact, no focal deficits  ?Psych: Normal mood and affect  ? ?ASSESSMENT:   ?[name] is a 71 y.o. male who presents for the following: ?1. 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 Mixed hyperlipidemia   ? ? ?PLAN:   ?1. 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 Mixed hyperlipidemia ?-Calcium score 180 which is 72nd percentile.  Statin therapy recommended.  Not wanting to do this at this time.  He was pursuing a natural path.  Would like to recheck his cholesterol in the next few days to determine if he would start statin.  May be interested in Zetia.  Discussed this with him.  Given information.  Final discussions based on follow-up cholesterol.  He is doing everything else from a preventive standpoint.  Exercising well.  Blood pressure well controlled.  Denies any symptoms in office.  No need for further testing. ? ? ?  ? ?Disposition: Return if symptoms worsen or fail to improve. ? ?Medication Adjustments/Labs and Tests Ordered: ?Current medicines are reviewed at length with the patient today.  Concerns regarding medicines are outlined above.  ?Orders Placed This Encounter  ?Procedures  ? Lipid panel  ? ?No orders of the defined types were placed in this encounter. ? ? ?Patient Instructions  ?Medication Instructions:  ?Look up ZETIA  ? ?*If you need a refill on your cardiac medications before your next appointment, please call your pharmacy* ? ? ?Lab Work: ?LIPID (come back fasting, nothing to eat or drink, no lab appointment needed) ? ?If you have labs (blood work) drawn today and your tests are completely normal, you will receive your results only by: ?MyChart Message (if you have MyChart) OR ?A paper copy in the mail ?If you have any lab test that is  abnormal or we need to change your treatment, we will call you to review the results. ? ? ?Follow-Up: ?At [facility], you and your health needs are our priority.  As part of our continuing mission to provide you with exceptional heart care, we have created designated Provider Care Teams.  These Care Teams include your primary Cardiologist (physician) and Advanced Practice Providers (APPs -  Physician Assistants and Nurse Practitioners) who all work together to provide you with the care you need, when you need it. ? ?We recommend signing up for the patient portal called "MyChart".  Sign up  information is provided on this After Visit Summary.  MyChart is used to connect with patients for Virtual Visits (Telemedicine).  Patients are able to view lab/test results, encounter notes, upcoming appointments, etc.  Non-urgent messages can be sent to your provider as well.   ?To learn more about what you can do with MyChart, go to [email or web address].   ? ?Your next appointment:   ?As needed ? ?The format for your next appointment:   ?In Person ? ?Provider:   ?Lennie OdorWesley O'Neal, MD  ? ? ? ? ? ? ?  ? ?Time Spent with Patient: I have spent a total of 25 minutes with patient reviewing hospital notes, telemetry, EKGs, labs and examining the patient as well as establishing an assessment and plan that was discussed with the patient.  > 50% of time was spent in direct patient care. ? ?Signed, ?Gerri SporeWesley T. Flora Lipps'Neal, MD, [facility] ?[facility]  ?[address] ?[place], [place] [address] ?[phone]  ?09/29/2021 9:26 AM    ? ?

## 2021-09-29 ENCOUNTER — Encounter: Payer: Self-pay | Admitting: Cardiovascular Disease

## 2021-09-29 ENCOUNTER — Ambulatory Visit (INDEPENDENT_AMBULATORY_CARE_PROVIDER_SITE_OTHER): Payer: Medicare Other | Admitting: Cardiovascular Disease

## 2021-09-29 VITALS — BP 140/70 | HR 67 | Ht 72.0 in | Wt 243.0 lb

## 2021-09-29 DIAGNOSIS — E782 Mixed hyperlipidemia: Secondary | ICD-10-CM | POA: Diagnosis not present

## 2021-09-29 DIAGNOSIS — R931 Abnormal findings on diagnostic imaging of heart and coronary circulation: Secondary | ICD-10-CM

## 2021-09-29 NOTE — Patient Instructions (Signed)
Medication Instructions:  ?Look up [place]  ? ?*If you need a refill on your cardiac medications before your next appointment, please call your pharmacy* ? ? ?Lab Work: ?LIPID (come back fasting, nothing to eat or drink, no lab appointment needed) ? ?If you have labs (blood work) drawn today and your tests are completely normal, you will receive your results only by: ?MyChart Message (if you have MyChart) OR ?A paper copy in the mail ?If you have any lab test that is abnormal or we need to change your treatment, we will call you to review the results. ? ? ?Follow-Up: ?At [facility], you and your health needs are our priority.  As part of our continuing mission to provide you with exceptional heart care, we have created designated Provider Care Teams.  These Care Teams include your primary Cardiologist (physician) and Advanced Practice Providers (APPs -  Physician Assistants and Nurse Practitioners) who all work together to provide you with the care you need, when you need it. ? ?We recommend signing up for the patient portal called "MyChart".  Sign up information is provided on this After Visit Summary.  MyChart is used to connect with patients for Virtual Visits (Telemedicine).  Patients are able to view lab/test results, encounter notes, upcoming appointments, etc.  Non-urgent messages can be sent to your provider as well.   ?To learn more about what you can do with MyChart, go to [email or web address].   ? ?Your next appointment:   ?As needed ? ?The format for your next appointment:   ?In Person ? ?Provider:   ?Eleonore Chiquito, MD  ? ? ? ? ? ? ? ?

## 2022-08-08 ENCOUNTER — Ambulatory Visit (INDEPENDENT_AMBULATORY_CARE_PROVIDER_SITE_OTHER): Payer: Medicare Other | Admitting: Sports Medicine

## 2022-08-08 ENCOUNTER — Ambulatory Visit (INDEPENDENT_AMBULATORY_CARE_PROVIDER_SITE_OTHER): Payer: Medicare Other

## 2022-08-08 ENCOUNTER — Encounter: Payer: Self-pay | Admitting: Orthopaedic Surgery

## 2022-08-08 ENCOUNTER — Ambulatory Visit (INDEPENDENT_AMBULATORY_CARE_PROVIDER_SITE_OTHER): Payer: Medicare Other | Admitting: Orthopaedic Surgery

## 2022-08-08 ENCOUNTER — Other Ambulatory Visit: Payer: Medicare Other

## 2022-08-08 VITALS — Ht 72.0 in | Wt 242.8 lb

## 2022-08-08 DIAGNOSIS — M25552 Pain in left hip: Secondary | ICD-10-CM

## 2022-08-08 DIAGNOSIS — M1612 Unilateral primary osteoarthritis, left hip: Secondary | ICD-10-CM | POA: Diagnosis not present

## 2022-08-08 MED ORDER — LIDOCAINE HCL 1 % IJ SOLN
4.0000 mL | INTRAMUSCULAR | Status: AC | PRN
  Administered 2022-08-08: 4 mL

## 2022-08-08 MED ORDER — METHYLPREDNISOLONE ACETATE 40 MG/ML IJ SUSP
80.0000 mg | INTRAMUSCULAR | Status: AC | PRN
  Administered 2022-08-08: 80 mg via INTRA_ARTICULAR

## 2022-08-08 NOTE — Progress Notes (Signed)
   Procedure Note  Patient: Gerald Dorsey             Date of Birth: May 02, 1951           MRN: [id number]             Visit Date: 08/08/2022  Procedures: Visit Diagnoses:  1. Pain of left hip   2. Unilateral primary osteoarthritis, left hip    Large Joint Inj: L hip joint on 08/08/2022 2:50 PM Indications: pain Details: 22 G 3.5 in needle, ultrasound-guided anterior approach Medications: 4 mL lidocaine 1 %; 80 mg methylPREDNISolone acetate 40 MG/ML Outcome: tolerated well, no immediate complications  Procedure: US-guided intra-articular hip injection, left After discussion on risks/benefits/indications and informed verbal consent was obtained, a timeout was performed. Patient was lying supine on exam table. The hip was cleaned with betadine and alcohol swabs. Then utilizing ultrasound guidance, the patient's femoral head and neck junction was identified and subsequently injected with 4:2 lidocaine:depomedrol via an in-plane approach with ultrasound visualization of the injectate administered into the hip joint. Patient tolerated procedure well without immediate complications.  Procedure, treatment alternatives, risks and benefits explained, specific risks discussed. Consent was given by the patient. Immediately prior to procedure a time out was called to verify the correct patient, procedure, equipment, support staff and site/side marked as required. Patient was prepped and draped in the usual sterile fashion.      - I evaluated the patient about 10 minutes post-injection and he had good improvement in pain and range of motion - follow-up with Dr. Ninfa Linden as indicated; I am happy to see them as needed  Elba Barman, DO [place]  This note was dictated using Dragon naturally speaking software and may contain errors in syntax, spelling, or content which have not been identified prior to signing this note.

## 2022-08-08 NOTE — Progress Notes (Signed)
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  We actually replaced his right hip in 2019.  For 3 months now he has been having some pain in his left groin.  This has been new to him.  When he bends over to tie shoes on that left side he does experience left groin pain.  He denies any injuries.  He says his right total hip is done well for him.  He has had no other acute changes in medical status.  He is a young appearing 10.  On exam his right hip moves smoothly and fluidly.  The left hip does have some slight stiffness with rotation and some slight pain but no blocks to rotation.  A standing AP pelvis and lateral left hip is reviewed today and compared to films from 2019.  There is slight joint space narrowing when compared to previous films of the superior lateral aspect of the left hip as well as small osteophyte and slight flattening of the lateral femoral head.  The right total hip appears to be in good position.  He certainly may be developing some more pathology is as it relates to arthritis of his left hip.  I would like to send him to my partner Dr. Rolena Infante for hopefully a diagnostic and therapeutic steroid injection under ultrasound in the left hip joint.  The patient is definitely in favor of trying something like this.  We will see if this can be done soon and then I would like to see him back in about 3 weeks follow-up for repeat exam and to see how this is done for him overall.

## 2022-08-31 ENCOUNTER — Ambulatory Visit (INDEPENDENT_AMBULATORY_CARE_PROVIDER_SITE_OTHER): Payer: Medicare Other | Admitting: Orthopaedic Surgery

## 2022-08-31 ENCOUNTER — Telehealth: Payer: Self-pay

## 2022-08-31 ENCOUNTER — Encounter: Payer: Self-pay | Admitting: Orthopaedic Surgery

## 2022-08-31 DIAGNOSIS — M25562 Pain in left knee: Secondary | ICD-10-CM

## 2022-08-31 DIAGNOSIS — G8929 Other chronic pain: Secondary | ICD-10-CM | POA: Diagnosis not present

## 2022-08-31 DIAGNOSIS — M1712 Unilateral primary osteoarthritis, left knee: Secondary | ICD-10-CM

## 2022-08-31 DIAGNOSIS — M25552 Pain in left hip: Secondary | ICD-10-CM | POA: Diagnosis not present

## 2022-08-31 NOTE — Progress Notes (Signed)
The patient is well-known to me.  We replaced his right hip several years ago.  We recently saw him in March for his left hip and he did have some joint space narrowing so we sent him to Dr. Shon Baton for steroid injection under ultrasound in his left hip.  He has been having some left knee issues and we did x-ray his knee in 2020 on that left side showing significant medial joint line narrowing.  He does point to the medial joint line of his left knee as a source of his pain with also pain in the left hip in the groin.  He says he does not have a constant pain and today is a good day.  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  The left knee has become more bothersome for him and he wonders if this is overcompensating for his hip issue.  On exam his right operative hip moves smoothly and fluidly.  The left hip has stiffness with rotation and the left knee has medial joint line tenderness with some varus malalignment that is correctable.  At this point I do feel that the left knee could be addressed with hyaluronic acid.  He has failed other conservative treatments for that left knee before and this will be the next step.  At some point we may end up recommending a left hip replacement and eventually left knee replacement but we should at least try hyaluronic acid for his left knee and he agrees with this.  We will be in touch about getting this hopefully ordered and approved from insurance.  This patient is diagnosed with osteoarthritis of the knee(s).    Radiographs show evidence of joint space narrowing, osteophytes, subchondral sclerosis and/or subchondral cysts.  This patient has knee pain which interferes with functional and activities of daily living.    This patient has experienced inadequate response, adverse effects and/or intolerance with conservative treatments such as acetaminophen, NSAIDS, topical creams, physical therapy or regular exercise, knee bracing and/or weight loss.    This patient has experienced inadequate response or has a contraindication to intra articular steroid injections for at least 3 months.   This patient is not scheduled to have a total knee replacement within 6 months of starting treatment with viscosupplementation.

## 2022-08-31 NOTE — Telephone Encounter (Signed)
Left gel injection  

## 2022-09-01 NOTE — Telephone Encounter (Signed)
VOB submitted for Monovisc, left knee 

## 2023-01-16 ENCOUNTER — Encounter: Payer: Self-pay | Admitting: Orthopaedic Surgery

## 2023-01-16 ENCOUNTER — Ambulatory Visit: Payer: Medicare Other | Admitting: Orthopaedic Surgery

## 2023-01-16 DIAGNOSIS — M1612 Unilateral primary osteoarthritis, left hip: Secondary | ICD-10-CM | POA: Diagnosis not present

## 2023-01-16 DIAGNOSIS — M25552 Pain in left hip: Secondary | ICD-10-CM

## 2023-01-16 MED ORDER — CELECOXIB 200 MG PO CAPS: 200.0000 mg | ORAL_CAPSULE | Freq: Two times a day (BID) | ORAL | 3 refills | Status: DC | PRN

## 2023-01-16 NOTE — Progress Notes (Signed)
The patient is well-known to me.  He is an active 72 year old gentleman who just turned 72 few days ago.  We have replaced his right hip back in 2019 and that has done very well.  He has known well-documented arthritis of his left hip.  He has tried and failed conservative treatment at this point including even steroid injection in his left hip back in March of this year.  He said that really did not help.  He does take ibuprofen on a regular basis now due to his hip pain.  At this point it is detrimentally effecting his mobility, his quality of life and his actives daily living.  He is very active and healthy.  He does not take any medications at all right now other than occasional over-the-counter NSAIDs for his hip pain.  He says his left knee pain has eased off.  Previous x-rays earlier this year of his pelvis and left hip shows severe arthritis of the left hip with significant joint space narrowing as well as sclerotic changes and osteophytes.  There is a well-seated total hip arthroplasty on the right side.  On clinical exam his right operative hip moves smoothly and fluidly with no blocks to rotation.  His left painful hip has limitations in internal and external rotation with stiffness and pain in the groin.  There is pain on rotation.  At this point we will send in Celebrex as an anti-inflammatory.  He would like to have his hip replaced but would like to have this done in early December.  Will work on getting him scheduled.  He has had no acute changes in medical status and is otherwise very healthy.  Having had this type of surgery before he is fully aware of the risk and benefits of surgery and what to expect from an intraoperative and postoperative course.  We will work on getting this scheduled and we will be in touch.

## 2023-01-27 ENCOUNTER — Other Ambulatory Visit: Payer: Self-pay

## 2023-01-27 ENCOUNTER — Emergency Department (HOSPITAL_BASED_OUTPATIENT_CLINIC_OR_DEPARTMENT_OTHER)
Admission: EM | Admit: 2023-01-27 | Discharge: 2023-01-27 | Disposition: A | Discharge status: Home / Self Care | Payer: Medicare Other | Attending: Emergency Medicine | Admitting: Emergency Medicine

## 2023-01-27 NOTE — ED Notes (Signed)
Pt is asymptomatic at this time, now declines Covid testing. Will leave with wife who is now being discharged.

## 2023-02-08 ENCOUNTER — Telehealth: Payer: Self-pay

## 2023-02-08 NOTE — Telephone Encounter (Signed)
Left message for patient to call me back to discuss scheduling surgery.

## 2023-04-07 ENCOUNTER — Other Ambulatory Visit: Payer: Self-pay

## 2023-04-19 NOTE — Pre-Procedure Instructions (Addendum)
Surgical Instructions   Your procedure is scheduled on Tuesday, December 3rd. Report to [facility] Main Entrance "A" at 09:15 A.M., then check in with the Admitting office. Any questions or running late day of surgery: call [phone]  Questions prior to your surgery date: call [phone], Monday-Friday, 8am-4pm. If you experience any cold or flu symptoms such as cough, fever, chills, shortness of breath, etc. between now and your scheduled surgery, please notify us at the above number.     Remember:  Do not eat after midnight the night before your surgery  You may drink clear liquids until 08:15 AM the morning of your surgery.   Clear liquids allowed are: Water, Non-Citrus Juices (without pulp), Carbonated Beverages, Clear Tea (no milk, honey, etc.), Black Coffee Only (NO MILK, CREAM OR POWDERED CREAMER of any kind), and Gatorade.  Patient Instructions  The night before surgery:  No food after midnight. ONLY clear liquids after midnight  The day of surgery (if you do NOT have diabetes):  Drink ONE (1) Pre-Surgery Clear Ensure by 08:15 AM the morning of surgery. Drink in one sitting. Do not sip.  This drink was given to you during your hospital  pre-op appointment visit.  Nothing else to drink after completing the  Pre-Surgery Clear Ensure.          If you have questions, please contact your surgeon's office.    Take these medicines the morning of surgery with A SIP OF WATER  May take these medicines IF NEEDED: fexofenadine (ALLEGRA)    One week prior to surgery, STOP taking any Aspirin (unless otherwise instructed by your surgeon) Aleve, Naproxen, Ibuprofen, Motrin, Advil, celecoxib (CELEBREX), Goody's, BC's, all herbal medications, fish oil, and non-prescription vitamins.                     Do NOT Smoke (Tobacco/Vaping) for 24 hours prior to your procedure.  If you use a CPAP at night, you may bring your mask/headgear for your overnight stay.   You will be asked to  remove any contacts, glasses, piercing's, hearing aid's, dentures/partials prior to surgery. Please bring cases for these items if needed.    Patients discharged the day of surgery will not be allowed to drive home, and someone needs to stay with them for 24 hours.  SURGICAL WAITING ROOM VISITATION Patients may have no more than 2 support people in the waiting area - these visitors may rotate.   Pre-op nurse will coordinate an appropriate time for 1 ADULT support person, who may not rotate, to accompany patient in pre-op.  Children under the age of 51 must have an adult with them who is not the patient and must remain in the main waiting area with an adult.  If the patient needs to stay at the hospital during part of their recovery, the visitor guidelines for inpatient rooms apply.  Please refer to the [facility] website for the visitor guidelines for any additional information.   If you received a COVID test during your pre-op visit  it is requested that you wear a mask when out in public, stay away from anyone that may not be feeling well and notify your surgeon if you develop symptoms. If you have been in contact with anyone that has tested positive in the last 10 days please notify you surgeon.      Pre-operative 5 CHG Bathing Instructions   You can play a key role in reducing the risk of infection after surgery.  Your skin needs to be as free of germs as possible. You can reduce the number of germs on your skin by washing with CHG (chlorhexidine gluconate) soap before surgery. CHG is an antiseptic soap that kills germs and continues to kill germs even after washing.   DO NOT use if you have an allergy to chlorhexidine/CHG or antibacterial soaps. If your skin becomes reddened or irritated, stop using the CHG and notify one of our RNs at [phone].   Please shower with the CHG soap starting 4 days before surgery using the following schedule:     Please keep in mind the following:   DO NOT shave, including legs and underarms, starting the day of your first shower.   You may shave your face at any point before/day of surgery.  Place clean sheets on your bed the day you start using CHG soap. Use a clean washcloth (not used since being washed) for each shower. DO NOT sleep with pets once you start using the CHG.   CHG Shower Instructions:  Wash your face and private area with normal soap. If you choose to wash your hair, wash first with your normal shampoo.  After you use shampoo/soap, rinse your hair and body thoroughly to remove shampoo/soap residue.  Turn the water OFF and apply about 3 tablespoons (45 ml) of CHG soap to a CLEAN washcloth.  Apply CHG soap ONLY FROM YOUR NECK DOWN TO YOUR TOES (washing for 3-5 minutes)  DO NOT use CHG soap on face, private areas, open wounds, or sores.  Pay special attention to the area where your surgery is being performed.  If you are having back surgery, having someone wash your back for you may be helpful. Wait 2 minutes after CHG soap is applied, then you may rinse off the CHG soap.  Pat dry with a clean towel  Put on clean clothes/pajamas   If you choose to wear lotion, please use ONLY the CHG-compatible lotions on the back of this paper.   Additional instructions for the day of surgery: DO NOT APPLY any lotions, deodorants, cologne, or perfumes.   Do not bring valuables to the hospital. [facility] is not responsible for any belongings/valuables. Do not wear nail polish, gel polish, artificial nails, or any other type of covering on natural nails (fingers and toes) Do not wear jewelry or makeup Put on clean/comfortable clothes.  Please brush your teeth.  Ask your nurse before applying any prescription medications to the skin.     CHG Compatible Lotions   Aveeno Moisturizing lotion  Cetaphil Moisturizing Cream  Cetaphil Moisturizing Lotion  Clairol Herbal Essence Moisturizing Lotion, Dry Skin  Clairol Herbal Essence  Moisturizing Lotion, Extra Dry Skin  Clairol Herbal Essence Moisturizing Lotion, Normal Skin  Curel Age Defying Therapeutic Moisturizing Lotion with Alpha Hydroxy  Curel Extreme Care Body Lotion  Curel Soothing Hands Moisturizing Hand Lotion  Curel Therapeutic Moisturizing Cream, Fragrance-Free  Curel Therapeutic Moisturizing Lotion, Fragrance-Free  Curel Therapeutic Moisturizing Lotion, Original Formula  Eucerin Daily Replenishing Lotion  Eucerin Dry Skin Therapy Plus Alpha Hydroxy Crme  Eucerin Dry Skin Therapy Plus Alpha Hydroxy Lotion  Eucerin Original Crme  Eucerin Original Lotion  Eucerin Plus Crme Eucerin Plus Lotion  Eucerin TriLipid Replenishing Lotion  Keri Anti-Bacterial Hand Lotion  Keri Deep Conditioning Original Lotion Dry Skin Formula Softly Scented  Keri Deep Conditioning Original Lotion, Fragrance Free Sensitive Skin Formula  Keri Lotion Fast Absorbing Fragrance Free Sensitive Skin Formula  Keri Lotion Fast Absorbing Softly  Scented Dry Skin Formula  Keri Original Lotion  Keri Skin Renewal Lotion Keri Silky Smooth Lotion  Keri Silky Smooth Sensitive Skin Lotion  Nivea Body Creamy Conditioning Oil  Nivea Body Extra Enriched Lotion  Nivea Body Original Lotion  Nivea Body Sheer Moisturizing Lotion Nivea Crme  Nivea Skin Firming Lotion  NutraDerm 30 Skin Lotion  NutraDerm Skin Lotion  NutraDerm Therapeutic Skin Cream  NutraDerm Therapeutic Skin Lotion  ProShield Protective Hand Cream  Provon moisturizing lotion  Please read over the following fact sheets that you were given.

## 2023-04-24 ENCOUNTER — Encounter (HOSPITAL_COMMUNITY)
Admission: RE | Admit: 2023-04-24 | Discharge: 2023-04-24 | Disposition: A | Discharge status: Home / Self Care | Payer: Medicare Other | Source: Ambulatory Visit | Attending: Orthopaedic Surgery | Admitting: Orthopaedic Surgery

## 2023-04-24 ENCOUNTER — Other Ambulatory Visit: Payer: Self-pay

## 2023-04-24 ENCOUNTER — Encounter (HOSPITAL_COMMUNITY): Payer: Self-pay

## 2023-04-24 VITALS — BP 134/71 | HR 64 | Temp 98.4°F | Resp 17 | Ht 72.0 in | Wt 240.1 lb

## 2023-04-24 DIAGNOSIS — Z01812 Encounter for preprocedural laboratory examination: Secondary | ICD-10-CM | POA: Diagnosis present

## 2023-04-24 DIAGNOSIS — M1612 Unilateral primary osteoarthritis, left hip: Secondary | ICD-10-CM | POA: Insufficient documentation

## 2023-04-24 DIAGNOSIS — Z01818 Encounter for other preprocedural examination: Secondary | ICD-10-CM | POA: Diagnosis not present

## 2023-04-24 DIAGNOSIS — Z01810 Encounter for preprocedural cardiovascular examination: Secondary | ICD-10-CM | POA: Diagnosis present

## 2023-04-24 DIAGNOSIS — I10 Essential (primary) hypertension: Secondary | ICD-10-CM | POA: Diagnosis not present

## 2023-04-24 HISTORY — DX: Essential (primary) hypertension: I10

## 2023-04-24 LAB — SURGICAL PCR SCREEN
MRSA, PCR: NEGATIVE
Staphylococcus aureus: NEGATIVE

## 2023-04-24 LAB — TYPE AND SCREEN
ABO/RH(D): O POS
Antibody Screen: NEGATIVE

## 2023-04-24 NOTE — H&P (Signed)
TOTAL HIP ADMISSION H&P  Patient is admitted for left total hip arthroplasty.  Subjective:  Chief Complaint: left hip pain  HPI: Gerald Dorsey, 72 y.o. male, has a history of pain and functional disability in the left hip(s) due to arthritis and patient has failed non-surgical conservative treatments for greater than 12 weeks to include NSAID's and/or analgesics, weight reduction as appropriate, and activity modification.  Onset of symptoms was gradual starting 2 years ago with gradually worsening course since that time.The patient noted no past surgery on the left hip(s).  Patient currently rates pain in the left hip at 10 out of 10 with activity. Patient has night pain, worsening of pain with activity and weight bearing, trendelenberg gait, pain that interfers with activities of daily living, and pain with passive range of motion. Patient has evidence of subchondral sclerosis, periarticular osteophytes, and joint space narrowing by imaging studies. This condition presents safety issues increasing the risk of falls.  There is no current active infection.  Patient Active Problem List   Diagnosis Date Noted   Unilateral primary osteoarthritis, left hip 04/24/2023   Unilateral primary osteoarthritis, left knee 12/03/2018   Status post total replacement of right hip 04/24/2018   Primary osteoarthritis of both knees 08/01/2017   Past Medical History:  Diagnosis Date   Colon polyps    Hx of colon polyps per pt report - no date   History of kidney stones    Hyperlipidemia    Hypertension    Osteoarthritis    right hip    Past Surgical History:  Procedure Laterality Date   CATARACT EXTRACTION Bilateral    COLONOSCOPY     TOTAL HIP ARTHROPLASTY Right 04/24/2018   Procedure: RIGHT TOTAL HIP ARTHROPLASTY ANTERIOR APPROACH;  Surgeon: Kathryne Hitch, MD;  Location: [facility] OR;  Service: Orthopedics;  Laterality: Right;    No current facility-administered medications for this encounter.    Current Outpatient Medications  Medication Sig Dispense Refill Last Dose   celecoxib (CELEBREX) 200 MG capsule Take 1 capsule (200 mg total) by mouth 2 (two) times daily between meals as needed. 60 capsule 3    fexofenadine (ALLEGRA) 180 MG tablet Take 180 mg by mouth daily as needed for allergies or rhinitis.      lisinopril (ZESTRIL) 5 MG tablet Take 5 mg by mouth daily.      tadalafil (CIALIS) 5 MG tablet Take 5 mg by mouth daily as needed.      No Known Allergies  Social History   Tobacco Use   Smoking status: Never   Smokeless tobacco: Never  Substance Use Topics   Alcohol use: Yes    Alcohol/week: 2.0 standard drinks of alcohol    Types: 2 Standard drinks or equivalent per week    Comment: 1-2 drinks per week    Family History  Problem Relation Age of Onset   Hyperlipidemia Mother    Hypertension Mother    Aneurysm Father      Review of Systems  Objective:  Physical Exam Vitals reviewed.  Constitutional:      Appearance: Normal appearance. He is obese.  HENT:     Head: Normocephalic.  Eyes:     Extraocular Movements: Extraocular movements intact.     Pupils: Pupils are equal, round, and reactive to light.  Cardiovascular:     Rate and Rhythm: Normal rate.     Pulses: Normal pulses.  Pulmonary:     Effort: Pulmonary effort is normal.     Breath sounds:  Normal breath sounds.  Abdominal:     Palpations: Abdomen is soft.  Musculoskeletal:     Cervical back: Normal range of motion and neck supple.     Left hip: Tenderness and bony tenderness present. Decreased range of motion. Decreased strength.  Neurological:     Mental Status: He is alert and oriented to person, place, and time.  Psychiatric:        Behavior: Behavior normal.     Vital signs in last 24 hours: Temp:  [98.4 F (36.9 C)] 98.4 F (36.9 C) (12/02 0910) Pulse Rate:  [64] 64 (12/02 0910) Resp:  [17] 17 (12/02 0910) BP: (134)/(71) 134/71 (12/02 0910) SpO2:  [98 %] 98 % (12/02  0910) Weight:  [108.9 kg] 108.9 kg (12/02 0910)  Labs:   Estimated body mass index is 32.56 kg/m as calculated from the following:   Height as of 04/24/23: 6' (1.829 m).   Weight as of 04/24/23: 108.9 kg.   Imaging Review Plain radiographs demonstrate severe degenerative joint disease of the left hip(s). The bone quality appears to be good for age and reported activity level.      Assessment/Plan:  End stage arthritis, left hip(s)  The patient history, physical examination, clinical judgement of the provider and imaging studies are consistent with end stage degenerative joint disease of the left hip(s) and total hip arthroplasty is deemed medically necessary. The treatment options including medical management, injection therapy, arthroscopy and arthroplasty were discussed at length. The risks and benefits of total hip arthroplasty were presented and reviewed. The risks due to aseptic loosening, infection, stiffness, dislocation/subluxation,  thromboembolic complications and other imponderables were discussed.  The patient acknowledged the explanation, agreed to proceed with the plan and consent was signed. Patient is being admitted for inpatient treatment for surgery, pain control, PT, OT, prophylactic antibiotics, VTE prophylaxis, progressive ambulation and ADL's and discharge planning.The patient is planning to be discharged home with home health services

## 2023-04-24 NOTE — Progress Notes (Signed)
PCP - Filomena Jungling, NP Cardiologist - denies  PPM/ICD - denies   Chest x-ray - 2-3 years ago per pt, [facility], normal per pt EKG - 04/24/23 Stress Test - 2019 per pt, normal per pt, pt states this was done at [facility] - denies Cardiac Cath - denies  Sleep Study - denies   DM- denies  ASA/Blood Thinner Instructions: n/a   ERAS Protcol - yes PRE-SURGERY Ensure- given at PAT  COVID TEST- n/a   Anesthesia review: no  Patient denies shortness of breath, fever, cough and chest pain at PAT appointment   All instructions explained to the patient, with a verbal understanding of the material. Patient agrees to go over the instructions while at home for a better understanding.  The opportunity to ask questions was provided.

## 2023-04-25 ENCOUNTER — Ambulatory Visit (HOSPITAL_COMMUNITY): Payer: Medicare Other

## 2023-04-25 ENCOUNTER — Observation Stay (HOSPITAL_COMMUNITY)
Admission: RE | Admit: 2023-04-25 | Discharge: 2023-04-26 | Disposition: A | Discharge status: Home / Self Care | Payer: Medicare Other | Attending: Orthopaedic Surgery | Admitting: Orthopaedic Surgery

## 2023-04-25 ENCOUNTER — Other Ambulatory Visit (HOSPITAL_COMMUNITY): Payer: Self-pay | Admitting: Orthopaedic Surgery

## 2023-04-25 ENCOUNTER — Observation Stay (HOSPITAL_COMMUNITY): Payer: Medicare Other

## 2023-04-25 ENCOUNTER — Encounter (HOSPITAL_COMMUNITY): Payer: Self-pay | Admitting: Orthopaedic Surgery

## 2023-04-25 ENCOUNTER — Other Ambulatory Visit: Payer: Self-pay

## 2023-04-25 ENCOUNTER — Ambulatory Visit (HOSPITAL_COMMUNITY): Payer: Medicare Other | Admitting: Anesthesiology

## 2023-04-25 ENCOUNTER — Encounter (HOSPITAL_COMMUNITY)
Admission: RE | Disposition: A | Discharge status: Home / Self Care | Payer: Self-pay | Source: Home / Self Care | Attending: Orthopaedic Surgery

## 2023-04-25 ENCOUNTER — Ambulatory Visit (HOSPITAL_BASED_OUTPATIENT_CLINIC_OR_DEPARTMENT_OTHER): Payer: Medicare Other | Admitting: Anesthesiology

## 2023-04-25 DIAGNOSIS — Z96641 Presence of right artificial hip joint: Secondary | ICD-10-CM | POA: Insufficient documentation

## 2023-04-25 DIAGNOSIS — Z79899 Other long term (current) drug therapy: Secondary | ICD-10-CM | POA: Insufficient documentation

## 2023-04-25 DIAGNOSIS — M1612 Unilateral primary osteoarthritis, left hip: Secondary | ICD-10-CM

## 2023-04-25 DIAGNOSIS — Z96642 Presence of left artificial hip joint: Secondary | ICD-10-CM

## 2023-04-25 DIAGNOSIS — Z419 Encounter for procedure for purposes other than remedying health state, unspecified: Secondary | ICD-10-CM

## 2023-04-25 DIAGNOSIS — I10 Essential (primary) hypertension: Secondary | ICD-10-CM | POA: Diagnosis not present

## 2023-04-25 HISTORY — PX: TOTAL HIP ARTHROPLASTY: SHX124

## 2023-04-25 LAB — ABO/RH: ABO/RH(D): O POS

## 2023-04-25 SURGERY — ARTHROPLASTY, HIP, TOTAL, ANTERIOR APPROACH
Anesthesia: Spinal | Site: Hip | Laterality: Left

## 2023-04-25 MED ORDER — OXYCODONE HCL 5 MG PO TABS: 5.0000 mg | ORAL_TABLET | Freq: Once | ORAL | Status: DC | PRN

## 2023-04-25 MED ORDER — ORAL CARE MOUTH RINSE: 15.0000 mL | Freq: Once | OROMUCOSAL | Status: AC

## 2023-04-25 MED ORDER — SODIUM CHLORIDE 0.9 % IR SOLN
Status: DC | PRN
  Administered 2023-04-25: 1000 mL

## 2023-04-25 MED ORDER — OXYCODONE HCL 5 MG PO TABS
5.0000 mg | ORAL_TABLET | ORAL | Status: DC | PRN
  Filled 2023-04-25: qty 2

## 2023-04-25 MED ORDER — OXYCODONE HCL 5 MG PO TABS
10.0000 mg | ORAL_TABLET | ORAL | Status: DC | PRN
  Administered 2023-04-25: 10 mg via ORAL
  Administered 2023-04-26: 15 mg via ORAL
  Filled 2023-04-25: qty 3

## 2023-04-25 MED ORDER — ACETAMINOPHEN 325 MG PO TABS: 325.0000 mg | ORAL_TABLET | Freq: Four times a day (QID) | ORAL | Status: DC | PRN

## 2023-04-25 MED ORDER — METOCLOPRAMIDE HCL 5 MG PO TABS: 5.0000 mg | ORAL_TABLET | Freq: Three times a day (TID) | ORAL | Status: DC | PRN

## 2023-04-25 MED ORDER — CEFAZOLIN SODIUM-DEXTROSE 2-4 GM/100ML-% IV SOLN
2.0000 g | INTRAVENOUS | Status: AC
  Administered 2023-04-25: 2 g via INTRAVENOUS

## 2023-04-25 MED ORDER — PHENYLEPHRINE HCL-NACL 20-0.9 MG/250ML-% IV SOLN
INTRAVENOUS | Status: DC | PRN
  Administered 2023-04-25: 30 ug/min via INTRAVENOUS

## 2023-04-25 MED ORDER — 0.9 % SODIUM CHLORIDE (POUR BTL) OPTIME
TOPICAL | Status: DC | PRN
  Administered 2023-04-25: 1000 mL

## 2023-04-25 MED ORDER — METOCLOPRAMIDE HCL 5 MG/ML IJ SOLN: 5.0000 mg | Freq: Three times a day (TID) | INTRAMUSCULAR | Status: DC | PRN

## 2023-04-25 MED ORDER — METHOCARBAMOL 1000 MG/10ML IJ SOLN: 500.0000 mg | Freq: Four times a day (QID) | INTRAMUSCULAR | Status: DC | PRN

## 2023-04-25 MED ORDER — MENTHOL 3 MG MT LOZG: 1.0000 | LOZENGE | OROMUCOSAL | Status: DC | PRN

## 2023-04-25 MED ORDER — MIDAZOLAM HCL 2 MG/2ML IJ SOLN
INTRAMUSCULAR | Status: AC
  Filled 2023-04-25: qty 2

## 2023-04-25 MED ORDER — POVIDONE-IODINE 10 % EX SWAB
2.0000 | Freq: Once | CUTANEOUS | Status: AC
  Administered 2023-04-25: 2 via TOPICAL

## 2023-04-25 MED ORDER — ONDANSETRON HCL 4 MG/2ML IJ SOLN: 4.0000 mg | Freq: Once | INTRAMUSCULAR | Status: DC | PRN

## 2023-04-25 MED ORDER — LACTATED RINGERS IV SOLN
INTRAVENOUS | Status: DC
Start: 2023-04-25 — End: 2023-04-25

## 2023-04-25 MED ORDER — OXYCODONE HCL 5 MG/5ML PO SOLN: 5.0000 mg | Freq: Once | ORAL | Status: DC | PRN

## 2023-04-25 MED ORDER — HYDROMORPHONE HCL 1 MG/ML IJ SOLN
0.2500 mg | INTRAMUSCULAR | Status: DC | PRN
  Administered 2023-04-25: 0.5 mg via INTRAVENOUS

## 2023-04-25 MED ORDER — ONDANSETRON HCL 4 MG/2ML IJ SOLN
4.0000 mg | Freq: Four times a day (QID) | INTRAMUSCULAR | Status: DC | PRN
  Administered 2023-04-25: 4 mg via INTRAVENOUS
  Filled 2023-04-25: qty 2

## 2023-04-25 MED ORDER — MIDAZOLAM HCL 5 MG/5ML IJ SOLN
INTRAMUSCULAR | Status: DC | PRN
  Administered 2023-04-25: .5 mg via INTRAVENOUS

## 2023-04-25 MED ORDER — METHOCARBAMOL 500 MG PO TABS: 500.0000 mg | ORAL_TABLET | Freq: Four times a day (QID) | ORAL | Status: DC | PRN

## 2023-04-25 MED ORDER — ALUM & MAG HYDROXIDE-SIMETH 200-200-20 MG/5ML PO SUSP
30.0000 mL | ORAL | Status: DC | PRN
Start: 2023-04-25 — End: 2023-04-26

## 2023-04-25 MED ORDER — TRANEXAMIC ACID-NACL 1000-0.7 MG/100ML-% IV SOLN
1000.0000 mg | INTRAVENOUS | Status: AC
  Administered 2023-04-25: 1000 mg via INTRAVENOUS

## 2023-04-25 MED ORDER — TRANEXAMIC ACID-NACL 1000-0.7 MG/100ML-% IV SOLN
INTRAVENOUS | Status: AC
  Filled 2023-04-25: qty 100

## 2023-04-25 MED ORDER — ASPIRIN 81 MG PO CHEW
81.0000 mg | CHEWABLE_TABLET | Freq: Two times a day (BID) | ORAL | Status: DC
  Administered 2023-04-25 – 2023-04-26 (×2): 81 mg via ORAL
  Filled 2023-04-25 (×2): qty 1

## 2023-04-25 MED ORDER — PHENOL 1.4 % MT LIQD: 1.0000 | OROMUCOSAL | Status: DC | PRN

## 2023-04-25 MED ORDER — SODIUM CHLORIDE 0.9 % IV SOLN: INTRAVENOUS | Status: DC

## 2023-04-25 MED ORDER — DOCUSATE SODIUM 100 MG PO CAPS
100.0000 mg | ORAL_CAPSULE | Freq: Two times a day (BID) | ORAL | Status: DC
  Administered 2023-04-25 – 2023-04-26 (×2): 100 mg via ORAL
  Filled 2023-04-25 (×2): qty 1

## 2023-04-25 MED ORDER — PROPOFOL 500 MG/50ML IV EMUL
INTRAVENOUS | Status: DC | PRN
  Administered 2023-04-25: 30 mg via INTRAVENOUS
  Administered 2023-04-25: 35 ug/kg/min via INTRAVENOUS

## 2023-04-25 MED ORDER — CHLORHEXIDINE GLUCONATE 0.12 % MT SOLN: 15.0000 mL | Freq: Once | OROMUCOSAL | Status: AC

## 2023-04-25 MED ORDER — HYDROMORPHONE HCL 1 MG/ML IJ SOLN
INTRAMUSCULAR | Status: AC
  Filled 2023-04-25: qty 1

## 2023-04-25 MED ORDER — PANTOPRAZOLE SODIUM 40 MG PO TBEC
40.0000 mg | DELAYED_RELEASE_TABLET | Freq: Every day | ORAL | Status: DC
  Administered 2023-04-25 – 2023-04-26 (×2): 40 mg via ORAL
  Filled 2023-04-25 (×2): qty 1

## 2023-04-25 MED ORDER — CEFAZOLIN SODIUM-DEXTROSE 2-4 GM/100ML-% IV SOLN
2.0000 g | Freq: Four times a day (QID) | INTRAVENOUS | Status: AC
  Administered 2023-04-25 (×2): 2 g via INTRAVENOUS
  Filled 2023-04-25 (×2): qty 100

## 2023-04-25 MED ORDER — HYDROMORPHONE HCL 1 MG/ML IJ SOLN: 0.5000 mg | INTRAMUSCULAR | Status: DC | PRN

## 2023-04-25 MED ORDER — CEFAZOLIN SODIUM-DEXTROSE 2-4 GM/100ML-% IV SOLN
INTRAVENOUS | Status: AC
  Filled 2023-04-25: qty 100

## 2023-04-25 MED ORDER — CHLORHEXIDINE GLUCONATE 0.12 % MT SOLN
OROMUCOSAL | Status: AC
  Administered 2023-04-25: 15 mL via OROMUCOSAL
  Filled 2023-04-25: qty 15

## 2023-04-25 MED ORDER — DIPHENHYDRAMINE HCL 12.5 MG/5ML PO ELIX: 12.5000 mg | ORAL_SOLUTION | ORAL | Status: DC | PRN

## 2023-04-25 MED ORDER — ONDANSETRON HCL 4 MG PO TABS: 4.0000 mg | ORAL_TABLET | Freq: Four times a day (QID) | ORAL | Status: DC | PRN

## 2023-04-25 MED ORDER — BUPIVACAINE IN DEXTROSE 0.75-8.25 % IT SOLN
INTRATHECAL | Status: DC | PRN
  Administered 2023-04-25: 2 mL via INTRATHECAL

## 2023-04-25 SURGICAL SUPPLY — 45 items
BAG COUNTER SPONGE SURGICOUNT (BAG) ×1 IMPLANT
BENZOIN TINCTURE PRP APPL 2/3 (GAUZE/BANDAGES/DRESSINGS) ×1 IMPLANT
BLADE CLIPPER SURG (BLADE) IMPLANT
BLADE SAW SGTL 18X1.27X75 (BLADE) ×1 IMPLANT
COLLAR OFFSET CORAIL SZ 11 HIP (Stem) IMPLANT
CORAIL OFFSET COLLAR SZ 11 HIP (Stem) ×1 IMPLANT
COVER SURGICAL LIGHT HANDLE (MISCELLANEOUS) ×1 IMPLANT
CUP ACET PNNCL SECTR W/GRIP 56 (Hips) IMPLANT
DRAPE C-ARM 42X72 X-RAY (DRAPES) ×1 IMPLANT
DRAPE STERI IOBAN 125X83 (DRAPES) ×1 IMPLANT
DRAPE U-SHAPE 47X51 STRL (DRAPES) ×3 IMPLANT
DRSG AQUACEL AG ADV 3.5X10 (GAUZE/BANDAGES/DRESSINGS) ×1 IMPLANT
DURAPREP 26ML APPLICATOR (WOUND CARE) ×1 IMPLANT
ELECT BLADE 4.0 EZ CLEAN MEGAD (MISCELLANEOUS) ×1 IMPLANT
ELECT BLADE 6.5 EXT (BLADE) IMPLANT
ELECT REM PT RETURN 9FT ADLT (ELECTROSURGICAL) ×1 IMPLANT
ELECTRODE BLDE 4.0 EZ CLN MEGD (MISCELLANEOUS) ×1 IMPLANT
ELECTRODE REM PT RTRN 9FT ADLT (ELECTROSURGICAL) ×1 IMPLANT
FACESHIELD WRAPAROUND (MASK) ×2 IMPLANT
FACESHIELD WRAPAROUND OR TEAM (MASK) ×2 IMPLANT
GLOVE BIOGEL PI IND STRL 8 (GLOVE) ×2 IMPLANT
GLOVE ECLIPSE 8.0 STRL XLNG CF (GLOVE) ×1 IMPLANT
GLOVE ORTHO TXT STRL SZ7.5 (GLOVE) ×2 IMPLANT
GOWN STRL REUS W/ TWL LRG LVL3 (GOWN DISPOSABLE) ×2 IMPLANT
GOWN STRL REUS W/ TWL XL LVL3 (GOWN DISPOSABLE) ×2 IMPLANT
HEAD CERAMIC DELTA 36 PLUS 1.5 (Hips) IMPLANT
KIT BASIN OR (CUSTOM PROCEDURE TRAY) ×1 IMPLANT
KIT TURNOVER KIT B (KITS) ×1 IMPLANT
LINER NEUTRAL 52MMX36MMX56N (Liner) IMPLANT
MANIFOLD NEPTUNE II (INSTRUMENTS) ×1 IMPLANT
NS IRRIG 1000ML POUR BTL (IV SOLUTION) ×1 IMPLANT
PACK TOTAL JOINT (CUSTOM PROCEDURE TRAY) ×1 IMPLANT
PAD ARMBOARD 7.5X6 YLW CONV (MISCELLANEOUS) ×1 IMPLANT
PINN SECTOR W/GRIP ACE CUP 56 (Hips) ×1 IMPLANT
SET HNDPC FAN SPRY TIP SCT (DISPOSABLE) ×1 IMPLANT
STAPLER VISISTAT 35W (STAPLE) IMPLANT
STRIP CLOSURE SKIN 1/2X4 (GAUZE/BANDAGES/DRESSINGS) ×2 IMPLANT
SUT ETHIBOND NAB CT1 #1 30IN (SUTURE) ×1 IMPLANT
SUT MNCRL AB 4-0 PS2 18 (SUTURE) IMPLANT
SUT VIC AB 0 CT1 27XBRD ANBCTR (SUTURE) ×1 IMPLANT
SUT VIC AB 1 CT1 27XBRD ANBCTR (SUTURE) ×1 IMPLANT
SUT VIC AB 2-0 CT1 TAPERPNT 27 (SUTURE) ×1 IMPLANT
TOWEL GREEN STERILE FF (TOWEL DISPOSABLE) ×1 IMPLANT
TRAY FOLEY W/BAG SLVR 16FR ST (SET/KITS/TRAYS/PACK) IMPLANT
WATER STERILE IRR 1000ML POUR (IV SOLUTION) ×2 IMPLANT

## 2023-04-25 NOTE — Anesthesia Postprocedure Evaluation (Addendum)
Anesthesia Post Note  Patient: Gerald Dorsey  Procedure(s) Performed: LEFT TOTAL HIP ARTHROPLASTY ANTERIOR APPROACH (Left: Hip)     Patient location during evaluation: PACU Anesthesia Type: Spinal Level of consciousness: awake and alert and oriented Pain management: pain level controlled Vital Signs Assessment: post-procedure vital signs reviewed and stable Respiratory status: spontaneous breathing, nonlabored ventilation and respiratory function stable Cardiovascular status: blood pressure returned to baseline and stable Postop Assessment: no apparent nausea or vomiting, spinal receding, patient able to bend at knees, no headache and no backache Anesthetic complications: no   No notable events documented.  Last Vitals:  Vitals:   04/25/23 1430 04/25/23 1445  BP: (!) 150/62 (!) 141/81  Pulse: (!) 55 (!) 59  Resp: 12 15  Temp:    SpO2: 98% 97%    Last Pain:  Vitals:   04/25/23 1430  TempSrc:   PainSc: 0-No pain                 Kennadie Brenner A.

## 2023-04-25 NOTE — Interval H&P Note (Signed)
History and Physical Interval Note: The patient understands that we are proceeding today with a left total hip replacement to treat his significant left hip arthritis.  There has been no acute or interval change in his medical status.  The risks and benefits of surgery been discussed in detail and informed consent has been obtained.  The left operative hip has been marked.  04/25/2023 10:29 AM  Gerald Dorsey  has presented today for surgery, with the diagnosis of Osteoarthritis Left Hip.  The various methods of treatment have been discussed with the patient and family. After consideration of risks, benefits and other options for treatment, the patient has consented to  Procedure(s): LEFT TOTAL HIP ARTHROPLASTY ANTERIOR APPROACH (Left) as a surgical intervention.  The patient's history has been reviewed, patient examined, no change in status, stable for surgery.  I have reviewed the patient's chart and labs.  Questions were answered to the patient's satisfaction.     Kathryne Hitch

## 2023-04-25 NOTE — Op Note (Signed)
Operative Note  Date of operation: 04/25/2023 Preoperative diagnosis: Left hip primary osteoarthritis Postoperative diagnosis: Same  Procedure: Left direct anterior total hip arthroplasty  Implants: Implant Name Type Inv. Item Serial No. Manufacturer Lot No. LRB No. Used Action  PINN SECTOR W/GRIP ACE CUP 56 - [id number] Hips PINN SECTOR W/GRIP ACE CUP 56  [employer] [id number] Left 1 Implanted  LINER NEUTRAL [id number] - [id number] Liner LINER NEUTRAL [id number]  DEPUY ORTH[employer] [id number] Left 1 Implanted  CORAIL OFFSET COLLAR SZ 11 HIP - [id number] Stem CORAIL OFFSET COLLAR SZ 11 HIP  [employer]PU[employer]TH[employer] [id number] Left 1 Implanted  HEAD CERAMIC DELTA 36 PLUS 1.5 - [id number] Hips HEAD CERAMIC DELTA 36 PLUS 1.5  [employer]PU[employer] [id number] Left 1 Implanted   Surgeon: Vanita Panda. Magnus Ivan, MD Assistant: Rexene Edison, PA-C  Anesthesia: Spinal EBL: 200 cc Antibiotics: IV Ancef Complications: None   Indications: The patient is an active 72 year old gentleman with debilitating arthritis involving his left hip that has been well-documented with clinical exam and x-ray findings.  We actually replaced his right hip back in 2019 secondary to severe arthritis.  At this point his left hip pain has become daily.  He has failed conservative treatment for over a year and his left hip pain is definitely affecting his mobility, his quality of life and his actives daily living to the point he wishes to proceed with hip replacement left side and we agree with this as well.  We described the risk of acute blood loss anemia, nerve and vessel injury, fracture, infection, DVT, dislocation, implant failure, leg length differences and wound healing issues.  He understands that our goals are hopefully decreased pain, improved with mobility, and improved quality of life  Procedure description: After informed consent was obtained and the left hip was marked, the patient was brought to the operating room  and set up on the stretcher where spinal anesthesia was obtained.  He was then laid in spine additional stretcher and a Foley catheter was placed.  Traction boots were placed on both his feet and he was placed supine on the Hana fracture table with a perineal post in place in both legs and inline skeletal traction devices no traction applied.  His left operative hip and pelvis were assessed radiographically.  The left hip was prepped and draped with DuraPrep and sterile drapes.  A timeout was called and he is identified as correct patient the correct left hip.  An incision was then made just inferior and posterior to the ASIS and carried slightly obliquely down the leg.  Dissection was carried down to the tensor fascia lata muscle and the tensor fascia was then divided longitudinally to proceed with a direct anterior approach to the hip.  Circumflex vessels were identified and cauterized.  The hip capsule was identified and opened up in L-type format finding a moderate joint effusion.  Cobra retractors were placed around the medial and lateral femoral neck and a femoral neck cut was made with an oscillating saw just proximal to the lesser trochanter.  This cut was completed with an osteotome.  A corkscrew was placed in the femoral head and the femoral head was removed in its entirety finding a wide area devoid of cartilage.  A bent Hohmann was then placed over the medial acetabular rim and remnants of the acetabular labrum and other debris removed.  Reaming was initiated from a size 43 reamer and stepwise increments going up to a size 55 reamer with all reamers placed under direct visualization  and the last reamer placed under direct fluoroscopy as well in order to obtain the depth of reaming, the inclination and the anteversion.  We then placed the real DePuy sector GRIPTION acetabular component size 56 followed by 36+0 neutral polythene liner.  Attention was then turned to the femur.  With the left leg externally  rotated to 120 degrees, extended and adducted, a Mueller retractor was placed medially and a Hohmann directed by the greater trochanter.  The lateral joint capsule was released and a box cutting osteotome was used into the femoral canal.  Broaching was initiated using the Corail broaching system from a size 8 going up to a size 11.  With the size 11 placement of a standard offset femoral neck and a 36+1.5 trial hip ball.  We brought the leg over and up and with traction and internal rotation reduced in pelvis.  Based on radiographic and clinical assessment we needed more offset.  We dislocated the hip and removed the trial components.  We placed the real component size 11 with high offset and the real 36+1.5 ceramic head ball.  Again this was reduced in pelvis and we are pleased with radiographic and clinical assessment of leg length, range of motion, stability and offset.  The soft tissue was irrigated normal saline solution.  The joint capsule was closed interrupted #1 Ethibond suture followed by #1 Vicryl to close the tensor fascia.  0 Vicryl was used to close the deep tissue and 2-0 Vicryl was used for subcutaneous tissue.  Skin was closed with staples.  An Aquacel dressing was applied.  Patient was taken off the table and taken to the recovery in stable condition.  Rexene Edison, PA-C was present and assisted during entire case from beginning to end.  His assistance was critical and medically necessary for soft tissue management and retraction, helping guide implant placement and a layered closure of the wound.

## 2023-04-25 NOTE — Anesthesia Preprocedure Evaluation (Addendum)
Anesthesia Evaluation  Patient identified by MRN, date of birth, ID band Patient awake    Reviewed: Allergy & Precautions, NPO status , Patient's Chart, lab work & pertinent test results, reviewed documented beta blocker date and time   Airway Mallampati: II  TM Distance: >3 FB     Dental no notable dental hx. (+) Teeth Intact, Caps, Dental Advisory Given   Pulmonary neg pulmonary ROS   Pulmonary exam normal breath sounds clear to auscultation       Cardiovascular hypertension, Pt. on medications Normal cardiovascular exam Rhythm:Regular Rate:Normal  EKG 04/24/23 NSR, Normal    Neuro/Psych negative neurological ROS  negative psych ROS   GI/Hepatic negative GI ROS, Neg liver ROS,,,  Endo/Other  Hyperlipidemia  Renal/GU Renal InsufficiencyRenal diseaseHx/o renal calculi  negative genitourinary   Musculoskeletal  (+) Arthritis , Osteoarthritis,  OA left hip   Abdominal  (+) + obese  Peds  Hematology  (+) Blood dyscrasia, anemia   Anesthesia Other Findings   Reproductive/Obstetrics ED                             Anesthesia Physical Anesthesia Plan  ASA: 2  Anesthesia Plan:    Post-op Pain Management: Dilaudid IV   Induction: Intravenous  PONV Risk Score and Plan: 2 and Treatment may vary due to age or medical condition, Propofol infusion and Ondansetron  Airway Management Planned: Natural Airway and Simple Face Mask  Additional Equipment: None  Intra-op Plan:   Post-operative Plan:   Informed Consent: I have reviewed the patients History and Physical, chart, labs and discussed the procedure including the risks, benefits and alternatives for the proposed anesthesia with the patient or authorized representative who has indicated his/her understanding and acceptance.     Dental advisory given  Plan Discussed with: CRNA and Anesthesiologist  Anesthesia Plan Comments:          Anesthesia Quick Evaluation

## 2023-04-25 NOTE — Transfer of Care (Signed)
Immediate Anesthesia Transfer of Care Note  Patient: Gerald Dorsey  Procedure(s) Performed: LEFT TOTAL HIP ARTHROPLASTY ANTERIOR APPROACH (Left: Hip)  Patient Location: PACU  Anesthesia Type:Spinal  Level of Consciousness: awake  Airway & Oxygen Therapy: Patient Spontanous Breathing  Post-op Assessment: Report given to RN  Post vital signs: stable  Last Vitals:  Vitals Value Taken Time  BP 124/65 04/25/23 1330  Temp 36.6 C 04/25/23 1330  Pulse 55 04/25/23 1337  Resp 12 04/25/23 1337  SpO2 99 % 04/25/23 1337  Vitals shown include unfiled device data.  Last Pain:  Vitals:   04/25/23 1330  TempSrc:   PainSc: 0-No pain         Complications: No notable events documented.

## 2023-04-25 NOTE — Evaluation (Signed)
Physical Therapy Evaluation Patient Details Name: Rody Prokosch MRN: [id number] DOB: 1951/03/18 Today's Date: 04/25/2023  History of Present Illness  Pt is a 72 y/o male admitted 12/3 for elective L THA.   PMHx:  HTN, OAR THA  Clinical Impression  Pt admitted with/for elective L THA.  Pt generally mobilizing at a CGA level.  Pt currently limited functionally due to the problems listed below.  (see problems list.)  Pt will benefit from PT to maximize function and safety to be able to get home safely with available assist.         If plan is discharge home, recommend the following: Assistance with cooking/housework;Assist for transportation (PRN assist)   Can travel by private vehicle        Equipment Recommendations None recommended by PT  Recommendations for Other Services       Functional Status Assessment Patient has had a recent decline in their functional status and demonstrates the ability to make significant improvements in function in a reasonable and predictable amount of time.     Precautions / Restrictions Precautions Precautions: Anterior Hip Precaution Booklet Issued: No Restrictions Weight Bearing Restrictions: Yes LLE Weight Bearing: Weight bearing as tolerated      Mobility  Bed Mobility Overal bed mobility: Needs Assistance Bed Mobility: Supine to Sit, Sit to Supine     Supine to sit: Contact guard Sit to supine: Contact guard assist   General bed mobility comments: cues for techique    Transfers Overall transfer level: Needs assistance   Transfers: Sit to/from Stand Sit to Stand: Contact guard assist           General transfer comment: cues for hand placement    Ambulation/Gait Ambulation/Gait assistance: Contact guard assist Gait Distance (Feet): 30 Feet Assistive device: Rolling walker (2 wheels) Gait Pattern/deviations: Step-to pattern, Step-through pattern, Decreased stride length   Gait velocity interpretation: <1.31 ft/sec,  indicative of household ambulator   General Gait Details: stable progression of gait from step to  to  step throught.  Stairs            Wheelchair Mobility     Tilt Bed    Modified Rankin (Stroke Patients Only)       Balance Overall balance assessment: No apparent balance deficits (not formally assessed)                                           Pertinent Vitals/Pain Pain Assessment Pain Assessment: Faces Faces Pain Scale: Hurts even more Pain Location: hip Pain Descriptors / Indicators: Guarding, Discomfort Pain Intervention(s): Monitored during session    Home Living Family/patient expects to be discharged to:: Private residence Living Arrangements: Spouse/significant other Available Help at Discharge: Family;Available 24 hours/day Type of Home: House Home Access: Stairs to enter   [employer] of Steps: 1/1   Home Layout: Multi-level;Able to live on main level with bedroom/bathroom Home Equipment: Shower seat;BSC/3in1;Cane - single point;Grab bars - toilet      Prior Function Prior Level of Function : Independent/Modified Independent             Mobility Comments: Independent and active       Extremity/Trunk Assessment   Upper Extremity Assessment Upper Extremity Assessment: Overall WFL for tasks assessed    Lower Extremity Assessment Lower Extremity Assessment: Overall WFL for tasks assessed (L LE painful, but functional.)    Cervical /  Trunk Assessment Cervical / Trunk Assessment: Normal  Communication   Communication Communication: No apparent difficulties  Cognition Arousal: Alert Behavior During Therapy: WFL for tasks assessed/performed Overall Cognitive Status: Within Functional Limits for tasks assessed                                          General Comments      Exercises Total Joint Exercises Ankle Circles/Pumps: AROM, 15 reps Quad Sets: AROM, 10 reps, Supine Gluteal Sets:  AROM, 10 reps, Supine Heel Slides: AROM, Both, 10 reps   Assessment/Plan    PT Assessment Patient needs continued PT services  PT Problem List Decreased activity tolerance;Decreased mobility;Decreased knowledge of use of DME;Decreased knowledge of precautions;Pain       PT Treatment Interventions DME instruction;Gait training;Stair training;Functional mobility training;Therapeutic activities;Patient/family education;Therapeutic exercise    PT Goals (Current goals can be found in the Care Plan section)  Acute Rehab PT Goals Patient Stated Goal: Independent PT Goal Formulation: With patient Time For Goal Achievement: 05/02/23 Potential to Achieve Goals: Good    Frequency 7X/week     Co-evaluation               AM-PAC PT "6 Clicks" Mobility  Outcome Measure Help needed turning from your back to your side while in a flat bed without using bedrails?: A Little Help needed moving from lying on your back to sitting on the side of a flat bed without using bedrails?: A Little Help needed moving to and from a bed to a chair (including a wheelchair)?: A Little Help needed standing up from a chair using your arms (e.g., wheelchair or bedside chair)?: A Little Help needed to walk in hospital room?: A Little Help needed climbing 3-5 steps with a railing? : A Little 6 Click Score: 18    End of Session   Activity Tolerance: Patient tolerated treatment well Patient left: in bed;with call bell/phone within reach Nurse Communication: Mobility status PT Visit Diagnosis: Other abnormalities of gait and mobility (R26.89);Other (comment)    Time: 1740-1806 PT Time Calculation (min) (ACUTE ONLY): 26 min   Charges:   PT Evaluation $PT Eval Low Complexity: 1 Low PT Treatments $Gait Training: 8-22 mins PT General Charges $$ ACUTE PT VISIT: 1 Visit         04/25/2023  Jacinto Halim., PT Acute Rehabilitation Services [phone]  (office)  Eliseo Gum Dalyah Pla 04/25/2023, 6:29 PM

## 2023-04-25 NOTE — Anesthesia Procedure Notes (Signed)
Spinal  Patient location during procedure: OR Start time: 04/25/2023 11:46 AM End time: 04/25/2023 11:49 AM Reason for block: surgical anesthesia Staffing Performed by: Mal Amabile, MD Authorized by: Mal Amabile, MD   Preanesthetic Checklist Completed: patient identified, IV checked, site marked, risks and benefits discussed, surgical consent, monitors and equipment checked, pre-op evaluation and timeout performed Spinal Block Patient position: sitting Prep: DuraPrep and site prepped and draped Patient monitoring: heart rate, cardiac monitor, continuous pulse ox and blood pressure Approach: midline Location: L3-4 Injection technique: single-shot Needle Needle type: Pencan  Needle gauge: 24 G Needle length: 9 cm Needle insertion depth: 7 cm Assessment Sensory level: T4 Events: CSF return Additional Notes Patient tolerated procedure well. Adequate sensory level.Attempt x 3. Difficult due to poor positioning.

## 2023-04-26 ENCOUNTER — Encounter (HOSPITAL_COMMUNITY): Payer: Self-pay | Admitting: Orthopaedic Surgery

## 2023-04-26 ENCOUNTER — Other Ambulatory Visit: Payer: Self-pay

## 2023-04-26 DIAGNOSIS — M1612 Unilateral primary osteoarthritis, left hip: Secondary | ICD-10-CM | POA: Diagnosis not present

## 2023-04-26 LAB — BASIC METABOLIC PANEL
Anion gap: 8 (ref 5–15)
BUN: 12 mg/dL (ref 8–23)
CO2: 24 mmol/L (ref 22–32)
Calcium: 8.3 mg/dL — ABNORMAL LOW (ref 8.9–10.3)
Chloride: 106 mmol/L (ref 98–111)
Creatinine, Ser: 1.46 mg/dL — ABNORMAL HIGH (ref 0.61–1.24)
GFR, Estimated: 50 mL/min — ABNORMAL LOW (ref >60)
Glucose, Bld: 136 mg/dL — ABNORMAL HIGH (ref 70–99)
Potassium: 3.8 mmol/L (ref 3.5–5.1)
Sodium: 138 mmol/L (ref 135–145)

## 2023-04-26 LAB — CBC
HCT: 40 % (ref 39.0–52.0)
Hemoglobin: 13.1 g/dL (ref 13.0–17.0)
MCH: 29.9 pg (ref 26.0–34.0)
MCHC: 32.8 g/dL (ref 30.0–36.0)
MCV: 91.3 fL (ref 80.0–100.0)
Platelets: 202 10*3/uL (ref 150–400)
RBC: 4.38 MIL/uL (ref 4.22–5.81)
RDW: 13.5 % (ref 11.5–15.5)
WBC: 9.6 10*3/uL (ref 4.0–10.5)
nRBC: 0 % (ref 0.0–0.2)

## 2023-04-26 MED ORDER — METHOCARBAMOL 500 MG PO TABS: 500.0000 mg | ORAL_TABLET | Freq: Four times a day (QID) | ORAL | 1 refills | Status: AC | PRN

## 2023-04-26 MED ORDER — ASPIRIN 81 MG PO CHEW: 81.0000 mg | CHEWABLE_TABLET | Freq: Two times a day (BID) | ORAL | 0 refills | Status: DC

## 2023-04-26 MED ORDER — OXYCODONE HCL 5 MG PO TABS: 5.0000 mg | ORAL_TABLET | Freq: Four times a day (QID) | ORAL | 0 refills | Status: DC | PRN

## 2023-04-26 NOTE — Discharge Summary (Signed)
Patient ID: Gerald Dorsey MRN: [id number] DOB/AGE: 73-Sep-1952 73 y.o.  Admit date: 04/25/2023 Discharge date: 04/26/2023  Admission Diagnoses:  Principal Problem:   Unilateral primary osteoarthritis, left hip Active Problems:   Status post total replacement of left hip   Discharge Diagnoses:  Same  Past Medical History:  Diagnosis Date   Colon polyps    Hx of colon polyps per pt report - no date   History of kidney stones    Hyperlipidemia    Hypertension    Osteoarthritis    right hip    Surgeries: Procedure(s): LEFT TOTAL HIP ARTHROPLASTY ANTERIOR APPROACH on 04/25/2023   Consultants:   Discharged Condition: Improved  Hospital Course: Gerald Dorsey is an 72 y.o. male who was admitted 04/25/2023 for operative treatment ofUnilateral primary osteoarthritis, left hip. Patient has severe unremitting pain that affects sleep, daily activities, and work/hobbies. After pre-op clearance the patient was taken to the operating room on 04/25/2023 and underwent  Procedure(s): LEFT TOTAL HIP ARTHROPLASTY ANTERIOR APPROACH.    Patient was given perioperative antibiotics:  Anti-infectives (From admission, onward)    Start     Dose/Rate Route Frequency Ordered Stop   04/25/23 1745  ceFAZolin (ANCEF) IVPB 2g/100 mL premix        2 g 200 mL/hr over 30 Minutes Intravenous Every 6 hours 04/25/23 1658 04/25/23 2323   04/25/23 0945  ceFAZolin (ANCEF) IVPB 2g/100 mL premix        2 g 200 mL/hr over 30 Minutes Intravenous On call to O.R. 04/25/23 0943 04/25/23 1155   04/25/23 0945  ceFAZolin (ANCEF) 2-4 GM/100ML-% IVPB       Note to Pharmacy: Darrick Huntsman: cabinet override      04/25/23 0945 04/25/23 1155        Patient was given sequential compression devices, early ambulation, and chemoprophylaxis to prevent DVT.  Patient benefited maximally from hospital stay and there were no complications.    Recent vital signs: Patient Vitals for the past 24 hrs:  BP Temp Temp src Pulse Resp  SpO2  04/26/23 0752 136/69 99.4 F (37.4 C) Oral 83 18 93 %  04/26/23 0601 (!) 148/75 98.8 F (37.1 C) Oral 84 16 96 %  04/25/23 1709 (!) 165/76 98.5 F (36.9 C) Oral 71 -- 100 %  04/25/23 1630 (!) 161/72 97.9 F (36.6 C) -- 65 12 94 %  04/25/23 1615 (!) 156/66 -- -- 64 11 93 %  04/25/23 1600 (!) 151/65 -- -- 65 15 97 %  04/25/23 1545 (!) 159/65 -- -- 65 16 96 %  04/25/23 1530 (!) 149/67 -- -- 62 15 96 %  04/25/23 1515 (!) 146/64 -- -- 61 14 98 %  04/25/23 1500 (!) 155/73 -- -- 62 13 98 %  04/25/23 1445 (!) 141/81 -- -- (!) 59 15 97 %  04/25/23 1430 (!) 150/62 -- -- (!) 55 12 98 %  04/25/23 1415 (!) 150/66 -- -- (!) 56 12 99 %  04/25/23 1400 131/73 -- -- (!) 54 20 100 %  04/25/23 1345 126/63 -- -- (!) 54 16 96 %  04/25/23 1330 124/65 97.8 F (36.6 C) -- (!) 55 12 99 %  04/25/23 1328 116/68 -- -- (!) 59 12 97 %     Recent laboratory studies:  Recent Labs    04/26/23 0559  WBC 9.6  HGB 13.1  HCT 40.0  PLT 202  NA 138  K 3.8  CL 106  CO2 24  BUN 12  CREATININE 1.46*  GLUCOSE 136*  CALCIUM 8.3*     Discharge Medications:   Allergies as of 04/26/2023   No Known Allergies      Medication List     TAKE these medications    aspirin 81 MG chewable tablet Chew 1 tablet (81 mg total) by mouth 2 (two) times daily.   celecoxib 200 MG capsule Commonly known as: CELEBREX Take 1 capsule (200 mg total) by mouth 2 (two) times daily between meals as needed.   fexofenadine 180 MG tablet Commonly known as: ALLEGRA Take 180 mg by mouth daily as needed for allergies or rhinitis.   lisinopril 5 MG tablet Commonly known as: ZESTRIL Take 5 mg by mouth daily.   methocarbamol 500 MG tablet Commonly known as: ROBAXIN Take 1 tablet (500 mg total) by mouth every 6 (six) hours as needed for muscle spasms.   oxyCODONE 5 MG immediate release tablet Commonly known as: Oxy IR/ROXICODONE Take 1-2 tablets (5-10 mg total) by mouth every 6 (six) hours as needed for moderate pain  (pain score 4-6) (pain score 4-6).   tadalafil 5 MG tablet Commonly known as: CIALIS Take 5 mg by mouth daily as needed.               Durable Medical Equipment  (From admission, onward)           Start     Ordered   04/25/23 1659  DME 3 n 1  Once        04/25/23 1658   04/25/23 1659  DME Walker rolling  Once       Question Answer Comment  Walker: With 5 Inch Wheels   Patient needs a walker to treat with the following condition Status post total replacement of left hip      04/25/23 1658            Diagnostic Studies: DG HIP UNILAT WITH PELVIS 1V LEFT  Result Date: 04/25/2023 CLINICAL DATA:  Total left hip arthroplasty. Intraoperative fluoroscopy. EXAM: DG HIP (WITH OR WITHOUT PELVIS) 1V*L* COMPARISON:  Left hip radiographs 08/08/2022 FINDINGS: There is no evidence of hip fracture or dislocation. There is no evidence of arthropathy or other focal bone a Images were performed intraoperatively without the presence of a radiologist. The patient is undergoing total left hip arthroplasty. No hardware complication is seen. Partial visualization of prior total right hip arthroplasty. Total fluoroscopy images: 3 Total fluoroscopy time: 16 seconds Total dose: Radiation Exposure Index (as provided by the fluoroscopic device): 2.44 mGy air Kerma Please see intraoperative findings for further detail. IMPRESSION: Intraoperative fluoroscopy for total left hip arthroplasty. Electronically Signed   By: Neita Garnet M.D.   On: 04/25/2023 17:45   DG Pelvis Portable  Result Date: 04/25/2023 CLINICAL DATA:  Status post total left hip replacement. EXAM: PORTABLE PELVIS 1-2 VIEWS COMPARISON:  Radiograph dated 08/08/2022. FINDINGS: Total bilateral hip arthroplasties. The arthroplasty components appear intact and in anatomic alignment. No acute fracture or dislocation. Soft tissue air over the left hip, postoperative. IMPRESSION: Total bilateral hip arthroplasties. Electronically Signed   By:  Elgie Collard M.D.   On: 04/25/2023 17:35   DG C-Arm 1-60 Min-No Report  Result Date: 04/25/2023 Fluoroscopy was utilized by the requesting physician.  No radiographic interpretation.    Disposition: Discharge disposition: 01-Home or Self Care          Follow-up Information     Kathryne Hitch, MD Follow up in 2 week(s).   Specialty: Orthopedic Surgery Contact information:  [address] [place] [address] [phone]                  Signed: Kathryne Hitch 04/26/2023, 12:16 PM

## 2023-04-26 NOTE — TOC Transition Note (Signed)
Transition of Care [facility]) - CM/SW Discharge Note   Patient Details  Name: Gerald Dorsey MRN: [id number] Date of Birth: 1950-05-27  Transition of Care [facility]) CM/SW Contact:  Epifanio Lesches, RN Phone Number: 04/26/2023, 12:35 PM   Clinical Narrative:    Patient will DC to: home Anticipated DC date: 04/26/2023 Family notified: you Transport by: car  - S/p total replacement of left hip  Per MD patient ready for DC today.RN, patient, patient's family, and  [facility] notified of DC. Pt without DME needs. States already with equipment @ home. Wife to assist with care once d/c.  Pt without RX med concerns. Pt to pick up meds from local pharmacy. Post hospital f/u noted on AVS. Wife to provide transportation to home. RNCM will sign off for now as intervention is no longer needed. Please consult [place] again if new needs arise.    Final next level of care: Home w Home Health Services Barriers to Discharge: No Barriers Identified   Patient Goals and CMS Choice      Discharge Placement                         Discharge Plan and Services Additional resources added to the After Visit Summary for                            [facility] Arranged: PT HH Agency: [facility] Date [facility] Agency Contacted: 04/26/23 Time HH Agency Contacted: [phone] Representative spoke with at [facility] Agency: Haywood Lasso  Social Determinants of Health (SDOH) Interventions SDOH Screenings   Food Insecurity: No Food Insecurity (04/25/2023)  Housing: Low Risk  (04/25/2023)  Transportation Needs: No Transportation Needs (04/25/2023)  Utilities: Not At Risk (04/25/2023)  Financial Resource Strain: Low Risk  (04/04/2023)   Received from [facility]  Physical Activity: Sufficiently Active (04/04/2023)   Received from [facility]  Social Connections: Socially Integrated (04/04/2023)   Received from [facility]  Stress: No Stress Concern Present (04/04/2023)   Received from [facility]  Tobacco Use: Low Risk   (04/25/2023)     Readmission Risk Interventions     No data to display

## 2023-04-26 NOTE — Progress Notes (Signed)
Discharge instructions given. Patient verbalized understanding and all questions were answered.  ?

## 2023-04-26 NOTE — Progress Notes (Signed)
Physical Therapy Treatment Patient Details Name: Gerald Dorsey MRN: [id number] DOB: 08/04/1950 Today's Date: 04/26/2023   History of Present Illness Pt is a 72 y/o male admitted 12/3 for elective L THA.   PMHx:  HTN, OAR THA    PT Comments  Pt received in chair, pleasantly agreeable to therapy session and with only minimal c/o pain and no dizziness/lightheadedness or nausea throughout. Pt needing increased time for gait with slow, deliberate pace and good use of RW after initial instruction. Pt performs functional mobility tasks with up to Supervision for gait and up to Contact Guard for stair negotiation with RW/no rails. PTA reviewed HEP handout with him via verbal/visual demo and teachback of seated exercises. Also reviewed positioning at rest, use of iceman, transfer safety, RW management, and activity pacing. Pt continues to benefit from PT services to progress toward functional mobility goals, pt receptive to instruction and does not need a second session in afternoon prior to DC, RN notified.    If plan is discharge home, recommend the following: Assistance with cooking/housework;Assist for transportation (PRN assist)   Can travel by private vehicle        Equipment Recommendations  None recommended by PT (pt states he already has RW)    Recommendations for Other Services       Precautions / Restrictions Precautions Precautions: None Precaution Booklet Issued: Yes (comment) Precaution Comments: L hip HEP handout brought to room; no precs per MD orders Restrictions Weight Bearing Restrictions: Yes LLE Weight Bearing: Weight bearing as tolerated     Mobility  Bed Mobility Overal bed mobility: Needs Assistance             General bed mobility comments: pt received up in chair    Transfers Overall transfer level: Needs assistance Equipment used: Rolling walker (2 wheels) Transfers: Sit to/from Stand Sit to Stand: Supervision           General transfer comment:  from chair and elevated mat surface in PT gym; cues for safe UE placement, slightly decreased eccentric control when sitting    Ambulation/Gait Ambulation/Gait assistance: Supervision Gait Distance (Feet): 250 Feet (x2 with seated break ~3-4 mins to recover) Assistive device: Rolling walker (2 wheels) Gait Pattern/deviations: Step-to pattern, Step-through pattern, Decreased stride length Gait velocity: <0.4 m/s     General Gait Details: stable progression of gait from step to  to  step through. No lightheadedness, pt fatigued once to gym and took seated break, given water to drink. No buckling or LOB, min cues for closer proximity to RW PRN   Stairs Stairs: Yes Stairs assistance: Contact guard assist Stair Management: No rails, Step to pattern, Backwards, With walker Number of Stairs: 2 General stair comments: visual/verbal demo on stairs with RW prior to pt performing and fair carryover of instructions. CGA for safety with RW support while pt stepping up, no buckling or instability observed.   Wheelchair Mobility     Tilt Bed    Modified Rankin (Stroke Patients Only)       Balance Overall balance assessment: No apparent balance deficits (not formally assessed) (pt urinated standing in bathroom without apparent concern or difficulty with his balance; RW for pain mgmt/safety while walking)                                          Cognition Arousal: Alert Behavior During Therapy: [facility] for tasks assessed/performed  Overall Cognitive Status: Within Functional Limits for tasks assessed                                          Exercises Total Joint Exercises Ankle Circles/Pumps: AROM, 15 reps Long Arc Quad: AROM, Left, 10 reps, Seated    General Comments General comments (skin integrity, edema, etc.): no acute s/sx distress throughout      Pertinent Vitals/Pain Pain Assessment Pain Assessment: Faces Faces Pain Scale: Hurts little  more Pain Location: hip Pain Descriptors / Indicators: Discomfort, Sore Pain Intervention(s): Monitored during session, Premedicated before session, Repositioned, Ice applied    Home Living                          Prior Function            PT Goals (current goals can now be found in the care plan section) Acute Rehab PT Goals Patient Stated Goal: Independent PT Goal Formulation: With patient Time For Goal Achievement: 05/02/23 Progress towards PT goals: Progressing toward goals    Frequency    7X/week      PT Plan         AM-PAC PT "6 Clicks" Mobility   Outcome Measure  Help needed turning from your back to your side while in a flat bed without using bedrails?: None Help needed moving from lying on your back to sitting on the side of a flat bed without using bedrails?: None Help needed moving to and from a bed to a chair (including a wheelchair)?: A Little Help needed standing up from a chair using your arms (e.g., wheelchair or bedside chair)?: A Little Help needed to walk in hospital room?: A Little Help needed climbing 3-5 steps with a railing? : A Little 6 Click Score: 20    End of Session Equipment Utilized During Treatment: Gait belt Activity Tolerance: Patient tolerated treatment well Patient left: with call bell/phone within reach;in chair Nurse Communication: Mobility status;Other (comment) (pt does not need second PT session at this time) PT Visit Diagnosis: Other abnormalities of gait and mobility (R26.89);Other (comment)     Time: 1610-9604 PT Time Calculation (min) (ACUTE ONLY): 41 min  Charges:    $Gait Training: 23-37 mins $Therapeutic Activity: 8-22 mins PT General Charges $$ ACUTE PT VISIT: 1 Visit                     Barbar Brede P., PTA Acute Rehabilitation Services Secure Chat Preferred 9a-5:30pm Office: [phone]    Dorathy Kinsman [facility] 04/26/2023, 1:09 PM

## 2023-04-26 NOTE — Progress Notes (Signed)
Subjective: 1 Day Post-Op Procedure(s) (LRB): LEFT TOTAL HIP ARTHROPLASTY ANTERIOR APPROACH (Left) Patient reports pain as moderate.    Objective: Vital signs in last 24 hours: Temp:  [97.7 F (36.5 C)-98.8 F (37.1 C)] 98.8 F (37.1 C) (12/04 0601) Pulse Rate:  [54-84] 84 (12/04 0601) Resp:  [11-20] 16 (12/04 0601) BP: (116-165)/(62-81) 148/75 (12/04 0601) SpO2:  [93 %-100 %] 96 % (12/04 0601) Weight:  [108.9 kg] 108.9 kg (12/03 0947)  Intake/Output from previous day: 12/03 0701 - 12/04 0700 In: 700 [I.V.:500; IV Piggyback:200] Out: 1700 [Urine:1650; Blood:50] Intake/Output this shift: No intake/output data recorded.  Recent Labs    04/26/23 0559  HGB 13.1   Recent Labs    04/26/23 0559  WBC 9.6  RBC 4.38  HCT 40.0  PLT 202   Recent Labs    04/26/23 0559  NA 138  K 3.8  CL 106  CO2 24  BUN 12  CREATININE 1.46*  GLUCOSE 136*  CALCIUM 8.3*   No results for input(s): "LABPT", "INR" in the last 72 hours.  Sensation intact distally Intact pulses distally Dorsiflexion/Plantar flexion intact Incision: dressing C/D/I   Assessment/Plan: 1 Day Post-Op Procedure(s) (LRB): LEFT TOTAL HIP ARTHROPLASTY ANTERIOR APPROACH (Left) Up with therapy Discharge home with home health this afternoon if clears therapy.      Kathryne Hitch 04/26/2023, 7:39 AM

## 2023-04-26 NOTE — Discharge Instructions (Signed)

## 2023-05-03 ENCOUNTER — Other Ambulatory Visit: Payer: Self-pay | Admitting: Orthopaedic Surgery

## 2023-05-05 ENCOUNTER — Telehealth: Payer: Self-pay | Admitting: Orthopaedic Surgery

## 2023-05-05 NOTE — Telephone Encounter (Signed)
Patient called needing Rx refilled Oxycodone. Patient said he has 4 tabs left. The number to contact patient is [phone]

## 2023-05-06 ENCOUNTER — Other Ambulatory Visit: Payer: Self-pay | Admitting: Orthopaedic Surgery

## 2023-05-06 MED ORDER — OXYCODONE HCL 5 MG PO TABS: 5.0000 mg | ORAL_TABLET | Freq: Four times a day (QID) | ORAL | 0 refills | Status: AC | PRN

## 2023-05-08 ENCOUNTER — Ambulatory Visit (INDEPENDENT_AMBULATORY_CARE_PROVIDER_SITE_OTHER): Payer: Medicare Other | Admitting: Orthopaedic Surgery

## 2023-05-08 ENCOUNTER — Encounter: Payer: Self-pay | Admitting: Orthopaedic Surgery

## 2023-05-08 DIAGNOSIS — Z96642 Presence of left artificial hip joint: Secondary | ICD-10-CM

## 2023-05-08 NOTE — Progress Notes (Signed)
The patient is a 72 year old who is here for his first visit 2 weeks status post a left total hip replacement.  We replaced his right hip.  He is an active and young appearing 36.  Unlike his right hip he does have a seroma with his left hip.  I was able to aspirate 70 cc of fluid off of his hip to decompress it.  He was not really uncomfortable.  His staples been removed and Steri-Strips applied.  His calf is soft.  He has been compliant with a baby aspirin twice daily.  He can stop his aspirin.  He has been having home therapy and I want him to only work on balance and coordination for this next week to 2 weeks and no hip abduction exercises or strengthening to allow the soft tissue to calm down.  Overall though he looks great.  We will see him back in a month to see how is doing overall but no x-rays are needed.  If he does have a significant reaccumulation of the seroma and it is uncomfortable he knows to call us and let us know close we can work him in for an aspiration.

## 2023-05-11 ENCOUNTER — Ambulatory Visit (INDEPENDENT_AMBULATORY_CARE_PROVIDER_SITE_OTHER): Payer: Medicare Other | Admitting: Physician Assistant

## 2023-05-11 ENCOUNTER — Encounter: Payer: Self-pay | Admitting: Physician Assistant

## 2023-05-11 DIAGNOSIS — Z96642 Presence of left artificial hip joint: Secondary | ICD-10-CM | POA: Diagnosis not present

## 2023-05-11 NOTE — Progress Notes (Signed)
Office Visit Note   Patient: Gerald Dorsey           Date of Birth: Mar 01, 1951           MRN: [id number] Visit Date: 05/11/2023              Requested by: Filomena Jungling, NP [address] [place],  [place] [address] PCP: Filomena Jungling, NP  Chief Complaint  Patient presents with  . Left Hip - Pain      HPI: Patient is a pleasant 72 year old gentleman who is status post left total hip arthroplasty with Dr. Magnus Ivan.  Had developed a seroma.  70 cc were aspirated 3 days ago.  He feels like he is have return of some of the fluid requesting aspiration.  Denies any fever chills or change in pain  Assessment & Plan: Visit Diagnoses: Left hip seroma  Plan: 40 cc of blood-tinged clear fluid was aspirated without difficulty Band-Aid was applied.  Patient will follow-up  Follow-Up Instructions: With Dr. Magnus Ivan already has an appointment  Ortho Exam  Patient is alert, oriented, no adenopathy, well-dressed, normal affect, normal respiratory effort. Left hip well-healed surgical incision Steri-Strips in place no redness no erythema no warmth.  Nontender to palpation.  Imaging: No results found. No images are attached to the encounter.  Labs: No results found for: "HGBA1C", "ESRSEDRATE", "CRP", "LABURIC", "REPTSTATUS", "GRAMSTAIN", "CULT", "LABORGA"   No results found for: "ALBUMIN", "PREALBUMIN", "CBC"  No results found for: "MG" No results found for: "VD25OH"  No results found for: "PREALBUMIN"    Latest Ref Rng & Units 04/26/2023    5:59 AM 04/25/2018    2:08 AM 04/17/2018    8:37 AM  CBC EXTENDED  WBC 4.0 - 10.5 K/uL 9.6  10.4  5.8   RBC 4.22 - 5.81 MIL/uL 4.38  4.20  4.71   Hemoglobin 13.0 - 17.0 g/dL 40.1  02.7  25.3   HCT 39.0 - 52.0 % 40.0  38.3  43.0   Platelets 150 - 400 K/uL 202  195  213      There is no height or weight on file to calculate BMI.  Orders:  No orders of the defined types were placed in this encounter.  No orders of the defined types  were placed in this encounter.    Procedures: Large Joint Inj on 05/11/2023 2:34 PM Indications: pain and diagnostic evaluation Details: 18 G 1.5 in needle, anterolateral approach  Arthrogram: No  Aspirate: 40 mL blood-tinged Outcome: tolerated well, no immediate complications  After verbal consent was obtained area was prepped with alcohol followed by Betadine swab.  18-gauge needle was inserted 40 cc of blood-tinged fluid was aspirated without difficulty.  Band-Aid was applied patient ambulated from the clinic Procedure, treatment alternatives, risks and benefits explained, specific risks discussed. Consent was given by the patient.    Clinical Data: No additional findings.  ROS:  All other systems negative, except as noted in the HPI. Review of Systems  Objective: Vital Signs: There were no vitals taken for this visit.  Specialty Comments:  No specialty comments available.  PMFS History: Patient Active Problem List   Diagnosis Date Noted  . Status post total replacement of left hip 04/25/2023  . Unilateral primary osteoarthritis, left knee 12/03/2018  . Status post total replacement of right hip 04/24/2018  . Primary osteoarthritis of both knees 08/01/2017   Past Medical History:  Diagnosis Date  . Colon polyps    Hx of colon polyps per pt  report - no date  . History of kidney stones   . Hyperlipidemia   . Hypertension   . Osteoarthritis    right hip    Family History  Problem Relation Age of Onset  . Hyperlipidemia Mother   . Hypertension Mother   . Aneurysm Father     Past Surgical History:  Procedure Laterality Date  . CATARACT EXTRACTION Bilateral   . COLONOSCOPY    . TOTAL HIP ARTHROPLASTY Right 04/24/2018   Procedure: RIGHT TOTAL HIP ARTHROPLASTY ANTERIOR APPROACH;  Surgeon: Kathryne Hitch, MD;  Location: [facility] OR;  Service: Orthopedics;  Laterality: Right;  . TOTAL HIP ARTHROPLASTY Left 04/25/2023   Procedure: LEFT TOTAL HIP ARTHROPLASTY  ANTERIOR APPROACH;  Surgeon: Kathryne Hitch, MD;  Location: [facility] OR;  Service: Orthopedics;  Laterality: Left;   Social History   Occupational History  . Occupation: retired Catering manager work  Tobacco Use  . Smoking status: Never  . Smokeless tobacco: Never  Vaping Use  . Vaping status: Never Used  Substance and Sexual Activity  . Alcohol use: Yes    Alcohol/week: 2.0 standard drinks of alcohol    Types: 2 Standard drinks or equivalent per week    Comment: 1-2 drinks per week  . Drug use: Never  . Sexual activity: Not on file

## 2023-05-22 ENCOUNTER — Ambulatory Visit (INDEPENDENT_AMBULATORY_CARE_PROVIDER_SITE_OTHER): Payer: Medicare Other | Admitting: Physician Assistant

## 2023-05-22 ENCOUNTER — Other Ambulatory Visit: Payer: Self-pay

## 2023-05-22 ENCOUNTER — Encounter: Payer: Self-pay | Admitting: Physician Assistant

## 2023-05-22 DIAGNOSIS — M1612 Unilateral primary osteoarthritis, left hip: Secondary | ICD-10-CM

## 2023-05-22 DIAGNOSIS — M25552 Pain in left hip: Secondary | ICD-10-CM

## 2023-05-22 NOTE — Progress Notes (Signed)
Office Visit Note   Patient: Gerald Dorsey           Date of Birth: 1950-11-25           MRN: [id number] Visit Date: 05/22/2023              Requested by: Filomena Jungling, NP [address] [place],  [place] [address] PCP: Filomena Jungling, NP  Chief Complaint  Patient presents with   Left Hip - Wound Check    04/25/2023 Left THA      HPI: Patient is a pleasant 72 year old gentleman who is a patient of Dr. Eliberto Ivory.  He is 4 weeks status post left total hip arthroplasty.  Overall he is doing well denies any fever chills or significant pain.  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  He has had it aspirated by Dr. Magnus Ivan and by myself last week and feels like it is accumulated to almost the same amount.  The fluid both times been aspirated has not been purulent or concerning  Assessment & Plan: Visit Diagnoses:  1. Pain of left hip   2. Unilateral primary osteoarthritis, left hip     Plan: Seroma left hip.  Discussed with him trying ultrasound aspiration today with Franky Macho.  He said he would like to go forward with that today.  Follow-Up Instructions: No follow-ups on file.   Ortho Exam  Patient is alert, oriented, no adenopathy, well-dressed, normal affect, normal respiratory effort. Left hip well-healed surgical incision no warmth no erythema nontender to palpation he had has some local fluctuance consistent consistent with his seroma neurovascular intact negative Denna Haggard' sign  Imaging: No results found. No images are attached to the encounter.  Labs: No results found for: "HGBA1C", "ESRSEDRATE", "CRP", "LABURIC", "REPTSTATUS", "GRAMSTAIN", "CULT", "LABORGA"   No results found for: "ALBUMIN", "PREALBUMIN", "CBC"  No results found for: "MG" No results found for: "VD25OH"  No results found for: "PREALBUMIN"    Latest Ref Rng & Units 04/26/2023    5:59 AM 04/25/2018    2:08 AM 04/17/2018    8:37 AM  CBC EXTENDED  WBC 4.0 - 10.5 K/uL 9.6   10.4  5.8   RBC 4.22 - 5.81 MIL/uL 4.38  4.20  4.71   Hemoglobin 13.0 - 17.0 g/dL 60.6  30.1  60.1   HCT 39.0 - 52.0 % 40.0  38.3  43.0   Platelets 150 - 400 K/uL 202  195  213      There is no height or weight on file to calculate BMI.  Orders:  Orders Placed This Encounter  Procedures   US Guided Needle Placement - No Linked Charges   No orders of the defined types were placed in this encounter.    Procedures: No procedures performed  Clinical Data: No additional findings.  ROS:  All other systems negative, except as noted in the HPI. Review of Systems  Objective: Vital Signs: There were no vitals taken for this visit.  Specialty Comments:  No specialty comments available.  PMFS History: Patient Active Problem List   Diagnosis Date Noted   Status post total replacement of left hip 04/25/2023   Unilateral primary osteoarthritis, left knee 12/03/2018   Status post total replacement of right hip 04/24/2018   Primary osteoarthritis of both knees 08/01/2017   Past Medical History:  Diagnosis Date   Colon polyps    Hx of colon polyps per pt report - no date   History of kidney stones  Hyperlipidemia    Hypertension    Osteoarthritis    right hip    Family History  Problem Relation Age of Onset   Hyperlipidemia Mother    Hypertension Mother    Aneurysm Father     Past Surgical History:  Procedure Laterality Date   CATARACT EXTRACTION Bilateral    COLONOSCOPY     TOTAL HIP ARTHROPLASTY Right 04/24/2018   Procedure: RIGHT TOTAL HIP ARTHROPLASTY ANTERIOR APPROACH;  Surgeon: Kathryne Hitch, MD;  Location: [facility];  Service: Orthopedics;  Laterality: Right;   TOTAL HIP ARTHROPLASTY Left 04/25/2023   Procedure: LEFT TOTAL HIP ARTHROPLASTY ANTERIOR APPROACH;  Surgeon: Kathryne Hitch, MD;  Location: [facility];  Service: Orthopedics;  Laterality: Left;   Social History   Occupational History   Occupation: retired Catering manager work  Tobacco Use    Smoking status: Never   Smokeless tobacco: Never  Vaping Use   Vaping status: Never Used  Substance and Sexual Activity   Alcohol use: Yes    Alcohol/week: 2.0 standard drinks of alcohol    Types: 2 Standard drinks or equivalent per week    Comment: 1-2 drinks per week   Drug use: Never   Sexual activity: Not on file

## 2023-06-12 ENCOUNTER — Ambulatory Visit (INDEPENDENT_AMBULATORY_CARE_PROVIDER_SITE_OTHER): Payer: Medicare Other | Admitting: Orthopaedic Surgery

## 2023-06-12 DIAGNOSIS — Z96642 Presence of left artificial hip joint: Secondary | ICD-10-CM

## 2023-06-12 NOTE — Progress Notes (Signed)
The patient is an active and young appearing 73 year old who is now 6 weeks out from a left total hip.  We replaced his right hip in 2019.  He is an avid golfer and plans to slowly get back into his golfing.  He does walk sometimes up to 3 miles a day but he is slowly getting back into that.  He is hoping that his left knee will continue to improve the further he gets out from surgery.  He is walking without an assistive device and no significant limp.  His left operative hip moves smoothly.  There is still some slight swelling and we have aspirated seromas from his left hip at other postoperative visits.  The swelling is not significant enough today for me to aspirate that left hip.  He will continue to slowly increase his activities as comfort allows and he can start swinging a golf club.  Will see him back in 3 months.  If he still having left knee pain I would like to have a standing AP and lateral of his left knee.  We will also at this visit have a standing AP pelvis.

## 2023-09-11 ENCOUNTER — Ambulatory Visit (INDEPENDENT_AMBULATORY_CARE_PROVIDER_SITE_OTHER): Payer: PRIVATE HEALTH INSURANCE | Admitting: Orthopaedic Surgery

## 2023-09-11 ENCOUNTER — Encounter: Payer: Self-pay | Admitting: Orthopaedic Surgery

## 2023-09-11 ENCOUNTER — Other Ambulatory Visit (INDEPENDENT_AMBULATORY_CARE_PROVIDER_SITE_OTHER)

## 2023-09-11 DIAGNOSIS — Z96643 Presence of artificial hip joint, bilateral: Secondary | ICD-10-CM

## 2023-09-11 DIAGNOSIS — M1712 Unilateral primary osteoarthritis, left knee: Secondary | ICD-10-CM | POA: Diagnosis not present

## 2023-09-11 DIAGNOSIS — G8929 Other chronic pain: Secondary | ICD-10-CM

## 2023-09-11 DIAGNOSIS — M25562 Pain in left knee: Secondary | ICD-10-CM

## 2023-09-11 MED ORDER — LIDOCAINE HCL 1 % IJ SOLN
3.0000 mL | INTRAMUSCULAR | Status: AC | PRN
  Administered 2023-09-11: 3 mL

## 2023-09-11 MED ORDER — METHYLPREDNISOLONE ACETATE 40 MG/ML IJ SUSP
40.0000 mg | INTRAMUSCULAR | Status: AC | PRN
  Administered 2023-09-11: 40 mg via INTRA_ARTICULAR

## 2023-09-11 NOTE — Progress Notes (Signed)
 The patient is very well-known to me.  He is 4 months out from a left total hip replacement to treat significant left hip arthritis.  We replaced his right hip successfully in 2019.  He is 73 years old and very active and an avid golfer.  He has been dealing with some left knee pain and knee was unchanged for some time now with pivoting activities causing him discomfort with that left knee.  When he does exercise walk and walk straight in line he does not have issues with the left knee.  He is never had injections in that left knee or surgeries.  He said the left hip is doing fine.  He still has some lateral numbness that is slowly improving.  He is walking without any assistive device.  Both hips move smoothly and fluidly.  There is no blocks or rotation he seems to be doing well.  His left knee does have slight varus malalignment that is correctable with no effusion.  He has excellent range of motion of his left knee and it feels stable ligamentously but he has medial joint line tenderness.  X-rays of the left knee show tracking arthritis mainly involving the medial compartment with slight varus malalignment and near bone-on-bone wear the medial compartment.  An AP pelvis and lateral of the left hip shows a well-seated left total hip arthroplasty and right total hip replacement.  I did talk about trying a steroid injection in his left knee and work on quad training exercises.  He agreed to this and tolerated it well.  Will see him back in a month to see if he is a candidate for hyaluronic acid for long-term osteoarthritis pain relief for that left knee.    Procedure Note  Patient: Gerald Dorsey             Date of Birth: 02/21/1951           MRN: [id number]             Visit Date: 09/11/2023  Procedures: Visit Diagnoses:  1. Hx of bilateral hip replacements   2. Chronic pain of left knee     Large Joint Inj: L knee on 09/11/2023 10:19 AM Indications: diagnostic evaluation and pain Details: 22 G  1.5 in needle, superolateral approach  Arthrogram: No  Medications: 3 mL lidocaine  1 %; 40 mg methylPREDNISolone  acetate 40 MG/ML Outcome: tolerated well, no immediate complications Procedure, treatment alternatives, risks and benefits explained, specific risks discussed. Consent was given by the patient. Immediately prior to procedure a time out was called to verify the correct patient, procedure, equipment, support staff and site/side marked as required. Patient was prepped and draped in the usual sterile fashion.

## 2023-10-09 ENCOUNTER — Encounter: Payer: Self-pay | Admitting: Orthopaedic Surgery

## 2023-10-09 ENCOUNTER — Ambulatory Visit: Admitting: Orthopaedic Surgery

## 2023-10-09 DIAGNOSIS — M25562 Pain in left knee: Secondary | ICD-10-CM | POA: Diagnosis not present

## 2023-10-09 DIAGNOSIS — G8929 Other chronic pain: Secondary | ICD-10-CM

## 2023-10-09 DIAGNOSIS — M1712 Unilateral primary osteoarthritis, left knee: Secondary | ICD-10-CM | POA: Diagnosis not present

## 2023-10-09 NOTE — Progress Notes (Signed)
 The patient is 5 months status post a left total hip replacement.  We have replaced his right hip in the past.  He is an avid golfer.  We saw him a month ago due to his left knee giving him some problems and pain.  This was the first time he had any type of steroid injection of the left knee and he does have moderate arthritis involving mainly the medial joint line.  He said the steroid injection was very helpful and right now he is pain-free.  He walked 3 miles today.  He is an active and young appearing 73 year old.  He says the hips are doing well.    A left knee shows no effusion today.  His alignment is just slightly varus but otherwise excellent range of motion.  The knee is also stable.  From our standpoint we will see him back in 6 months with just a standing AP pelvis as we evaluate him at almost a year out from his left hip replacement.  He knows that if it gets to be the late summer and his knee is bothering him that we can always see him again for a left knee steroid injection.  If he needs a steroid injection later this year, he would then later be a candidate for hyaluronic acid.

## 2024-02-05 ENCOUNTER — Other Ambulatory Visit: Payer: Self-pay | Admitting: Orthopaedic Surgery

## 2024-03-25 ENCOUNTER — Encounter: Payer: Self-pay | Admitting: Radiology

## 2024-04-08 ENCOUNTER — Ambulatory Visit: Admitting: Orthopaedic Surgery

## 2024-04-08 ENCOUNTER — Encounter: Payer: Self-pay | Admitting: Orthopaedic Surgery

## 2024-04-08 ENCOUNTER — Other Ambulatory Visit: Payer: Self-pay

## 2024-04-08 DIAGNOSIS — Z96642 Presence of left artificial hip joint: Secondary | ICD-10-CM | POA: Diagnosis not present

## 2024-04-08 DIAGNOSIS — M25561 Pain in right knee: Secondary | ICD-10-CM | POA: Diagnosis not present

## 2024-04-08 MED ORDER — METHYLPREDNISOLONE ACETATE 40 MG/ML IJ SUSP
40.0000 mg | INTRAMUSCULAR | Status: AC | PRN
  Administered 2024-04-08: 40 mg via INTRA_ARTICULAR

## 2024-04-08 MED ORDER — LIDOCAINE HCL 1 % IJ SOLN
3.0000 mL | INTRAMUSCULAR | Status: AC | PRN
  Administered 2024-04-08: 3 mL

## 2024-04-08 NOTE — Progress Notes (Signed)
 The patient comes in today for follow-up as a relates to both of his hips being replaced.  His most recent hip replacement was done 11 months ago.  He says the knees are doing great.  We one-time have injected his left knee.  His right knee is bothering him some of the back of the knee.  He says the left knee injection helped.  He is regular golfer and is [employer].  He says he does wear knee sleeve on his left knee.  He is also having some right shoulder pain recently.  Both his hips are moving smoothly and fluidly no blocks or rotation.  Neither knee has an effusion but he does hurt in the posterior aspect of the right knee.  His right shoulder does show some signs of tendinitis with bursitis and impingement with good range of motion.  I did place a steroid injection in his right knee today which he tolerated well.  We talked about him trying that injection since is done well for his left knee.  An AP pelvis shows both his hip replacements in good position with no complicating features.  At this point follow-up for his hips can be as needed.  If he does develop worsening problems with his knees or shoulder he knows to reach out and let us  know.    Procedure Note  Patient: Gerald Dorsey             Date of Birth: 1951-02-16           MRN: [id number]             Visit Date: 04/08/2024  Procedures: Visit Diagnoses:  1. History of left hip replacement   2. Acute pain of right knee     Large Joint Inj: R knee on 04/08/2024 10:46 AM Indications: diagnostic evaluation and pain Details: 22 G 1.5 in needle, superolateral approach  Arthrogram: No  Medications: 3 mL lidocaine  1 %; 40 mg methylPREDNISolone  acetate 40 MG/ML Outcome: tolerated well, no immediate complications Procedure, treatment alternatives, risks and benefits explained, specific risks discussed. Consent was given by the patient. Immediately prior to procedure a time out was called to verify the correct  patient, procedure, equipment, support staff and site/side marked as required. Patient was prepped and draped in the usual sterile fashion.
